# Patient Record
Sex: Female | Born: 2000 | Race: Black or African American | Hispanic: No | Marital: Single
Health system: Midwestern US, Academic
[De-identification: ages and names within clinical notes are randomized; demographics above are authoritative.]

---
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
## 2020-02-20 IMAGING — MR MRI ANKLE LT WO CONTRAST
6 of 7 series · 38 of 40 positions shown · non-contrast
Comparison: None.

INDICATION: Left ankle pain. Evaluate for peroneal strain.
TECHNIQUE: Multiplanar, multiecho imaging of the left ankle was performed without contrast, including T1-weighted and fluid sensitive sequences without intravenous contrast.

[Series 3: t2_sag_fs · sagittal · left · 3.0mm · 0.47mm/px · 5 of 22 slices shown]
[im 1/22]
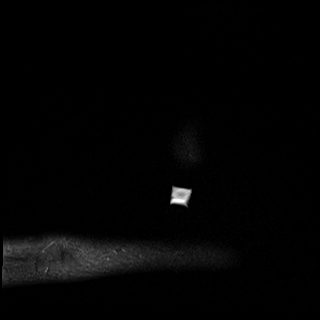
[im 6/22]
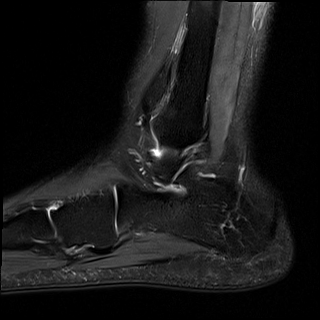
[im 11/22]
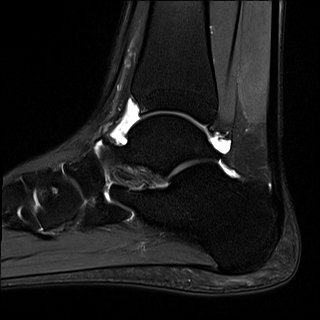
[im 16/22]
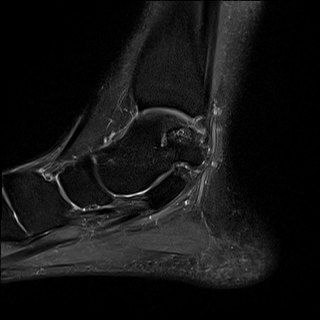
[im 22/22]
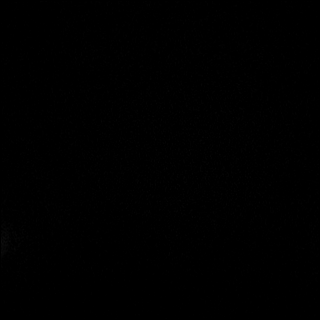

[Series 4: t1_sag · sagittal · left · 3.0mm · 0.39mm/px · 5 of 22 slices shown]
[im 1/22]
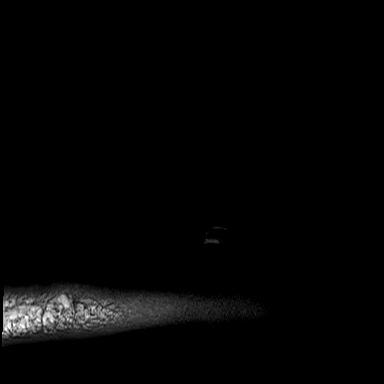
[im 6/22]
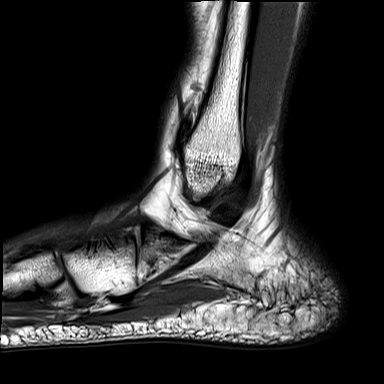
[im 11/22]
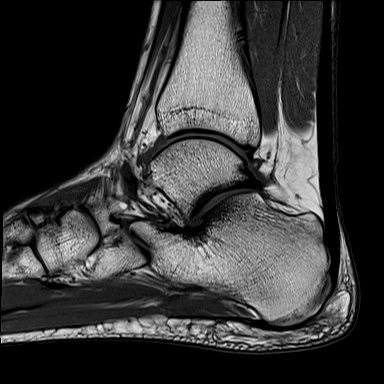
[im 16/22]
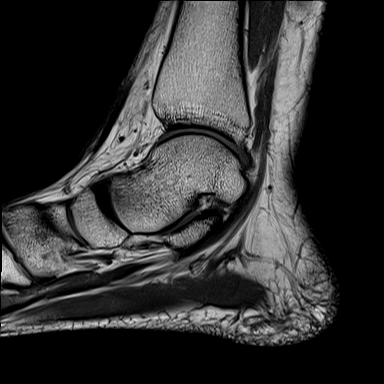
[im 22/22]
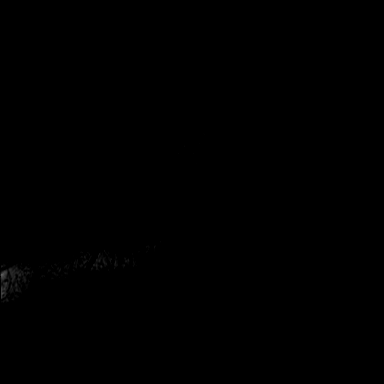

[Series 5: t1_axial · axial · left · 3.0mm · 0.39mm/px · z∈[-32,+76]mm · 6 of 28 slices shown]
[im 1/28]
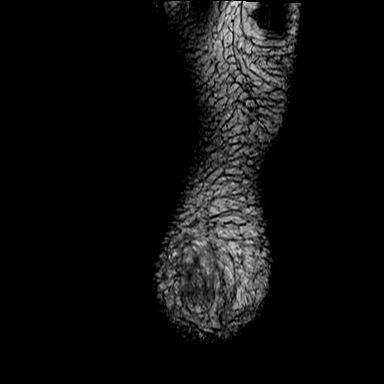
[im 6/28]
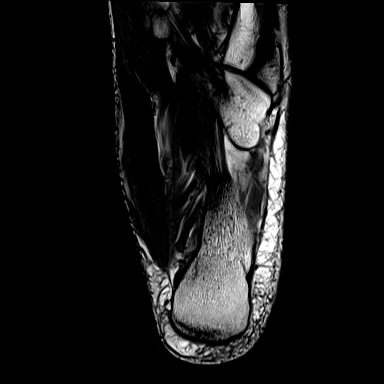
[im 11/28]
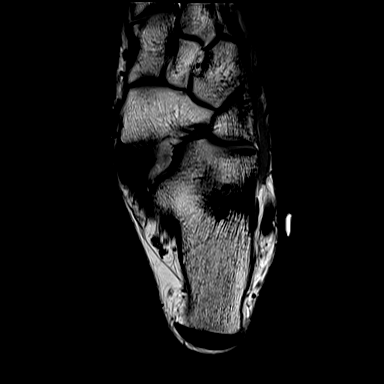
[im 17/28]
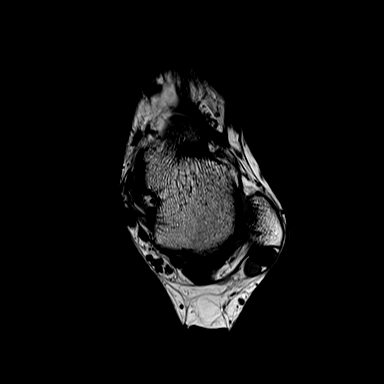
[im 22/28]
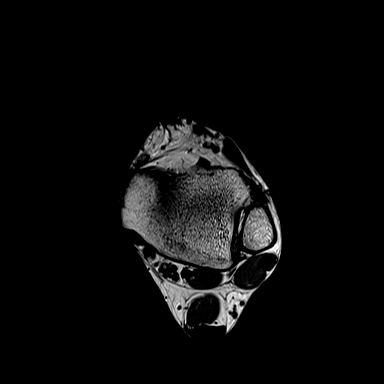
[im 28/28]
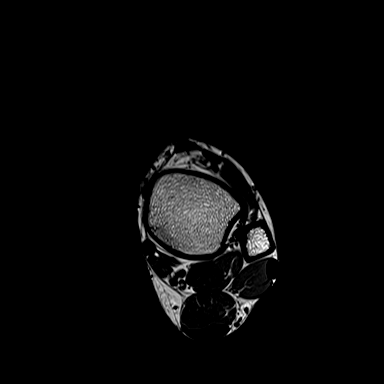

[Series 7: t2_cor_fs · coronal · left · 3.5mm · 0.47mm/px · 8 of 34 slices shown]
[im 1/34]
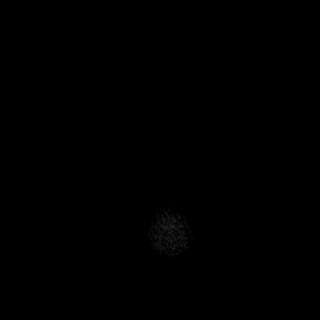
[im 5/34]
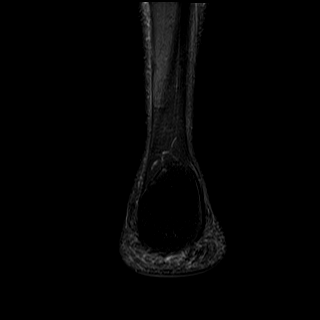
[im 10/34]
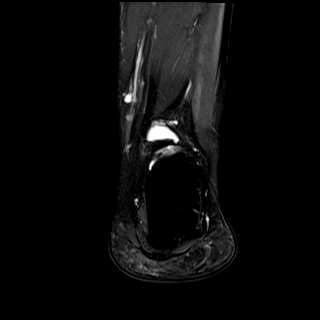
[im 15/34]
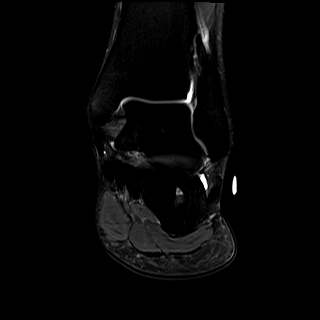
[im 19/34]
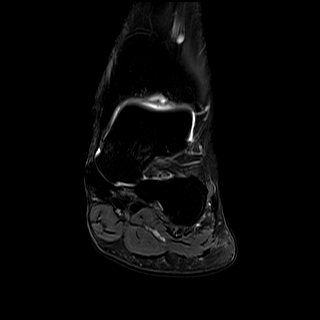
[im 24/34]
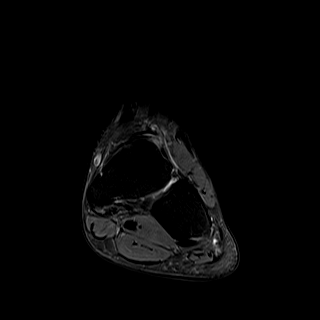
[im 29/34]
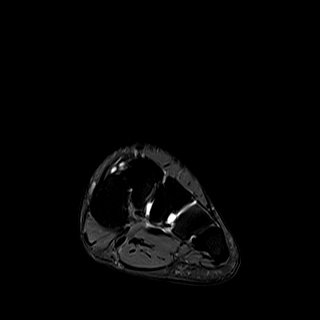
[im 34/34]
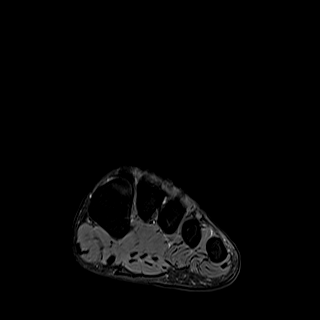

[Series 11: t2_axial_fs · axial · left · 3.0mm · 0.47mm/px · z∈[-32,+76]mm · 6 of 28 slices shown]
[im 1/28]
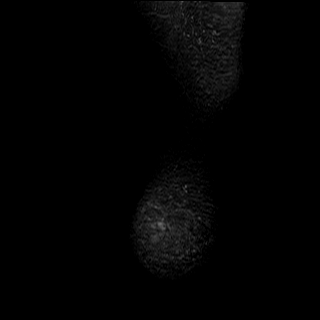
[im 6/28]
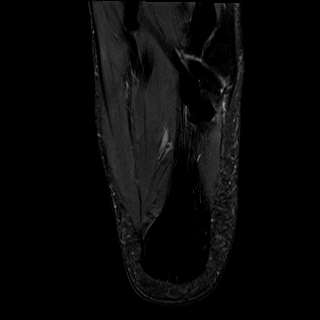
[im 11/28]
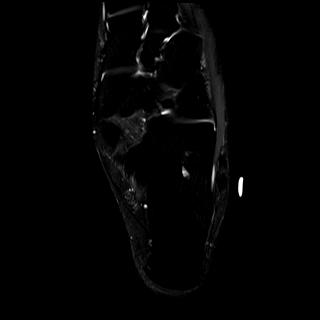
[im 17/28]
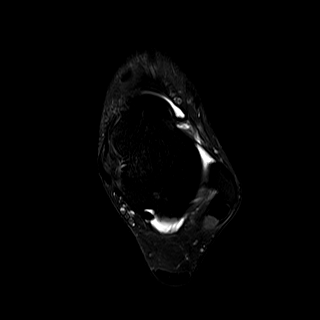
[im 22/28]
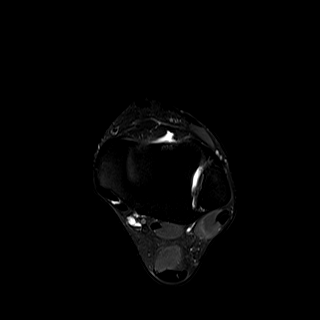
[im 28/28]
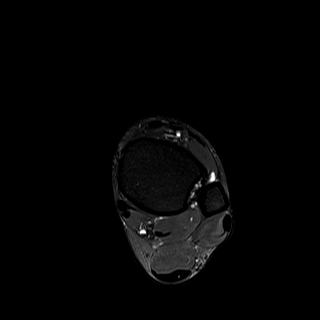

[Series 100: pd_axial_fs_obli · axial · left · 3.0mm · 0.47mm/px · z∈[+18,+83]mm · 8 of 34 slices shown]
[im 1/34]
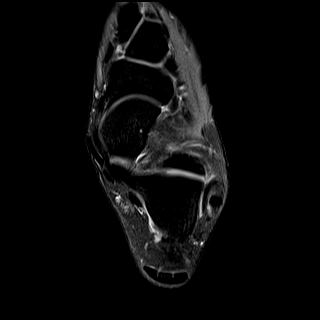
[im 5/34]
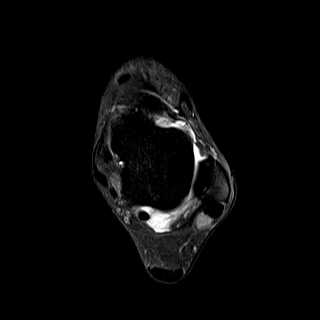
[im 10/34]
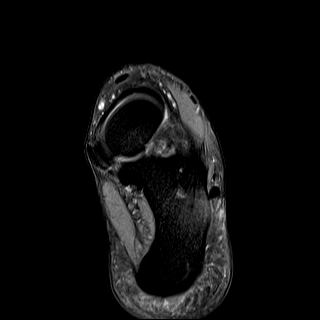
[im 15/34]
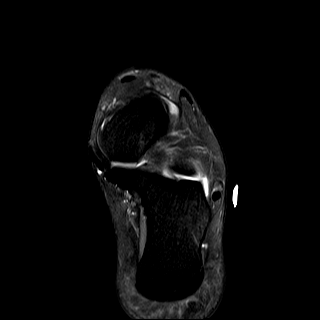
[im 19/34]
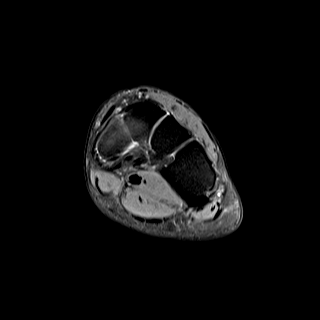
[im 24/34]
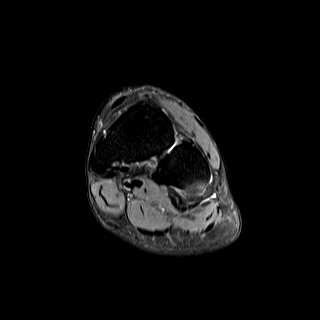
[im 29/34]
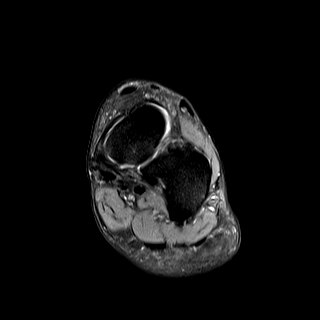
[im 34/34]
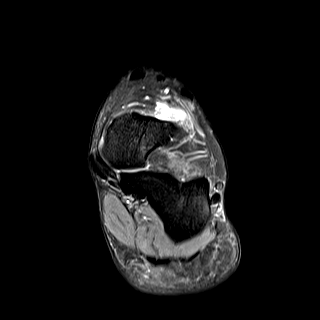

[38 of 40 positions shown; findings below may reference images not displayed]

FINDINGS: LIGAMENTS:  

Anterior and posterior tibiofibular ligaments are intact.

Anterior and posterior talofibular ligaments, and the calcaneofibular ligament, are intact.

Deltoid and spring ligaments are intact.

TENDONS:

Achilles: Intact.

Peroneus longus and brevis: Mild focal tendinosis of the peroneus brevis, in the inframalleolar region. Peroneus longus tendon is intact. No tear.

Posterior tibialis, flexor digitorum longus, and flexor hallucis longus: Intact.

Anterior extensor tendons: Intact.

OTHER:

Sinus tarsi: Normal.

Tarsal tunnel: Normal.

Plantar fascia: Normal.

Marrow and cartilage: No acute fracture. No bone contusion. No erosion. No tarsal coalition. No osteochondral erosion of the talar dome.

Small tibiotalar joint effusion. Question of small low-grade partial-thickness chondral defect at the anterior aspect of the tibial plafond (series 3, image 11).

Edema noted within the partially imaged distal peroneus longus/brevis muscles, at the level of the distal fibular metaphysis, likely overuse/delayed onset muscle soreness.
IMPRESSION: 1. Mild focal tendinosis of the peroneus brevis, in the inframalleolar region. No tenosynovitis or tear of the lateral flexor tendons.

2. Small tibiotalar joint effusion.

3. Edema within the partially visualized distal peroneus longus/brevis muscles, at the level of the distal fibular metaphysis, likely overuse/delayed onset muscle soreness.

## 2020-05-29 IMAGING — MR MRI FOREFOOT RT WO CONTRAST
6 series · 34 of 40 positions shown · non-contrast
Comparison: None available.

INDICATION: Right foot pain
TECHNIQUE: Multiplanar, multisequence imaging of the right forefoot was performed without contrast.

[Series 3: t1_sag · sagittal · right · 3.0mm · 0.37mm/px · 7 of 32 slices shown]
[im 1/32]
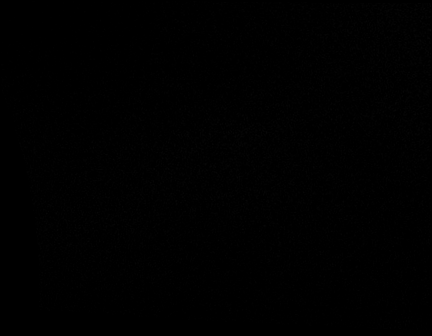
[im 6/32]
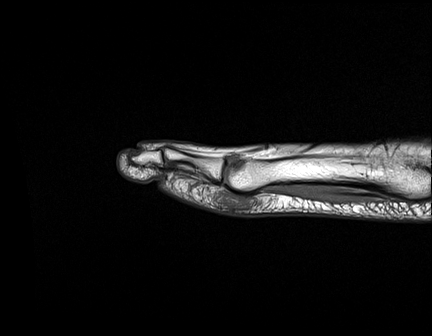
[im 11/32]
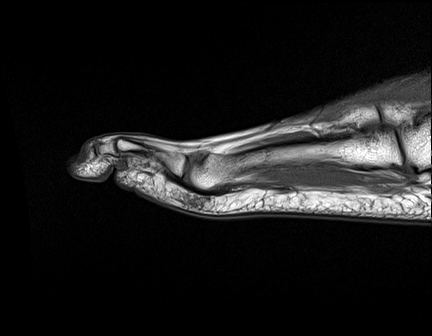
[im 16/32]
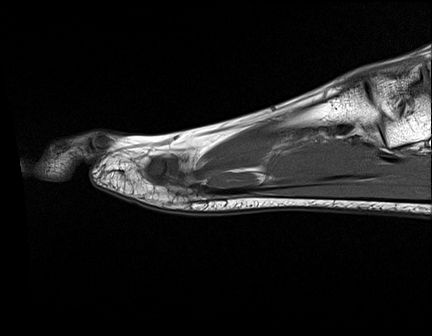
[im 21/32]
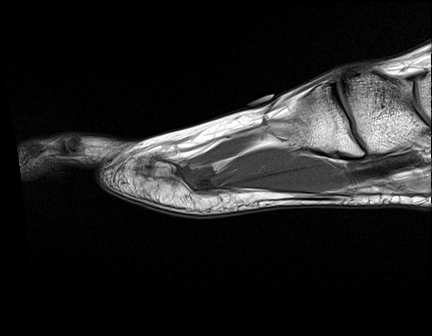
[im 26/32]
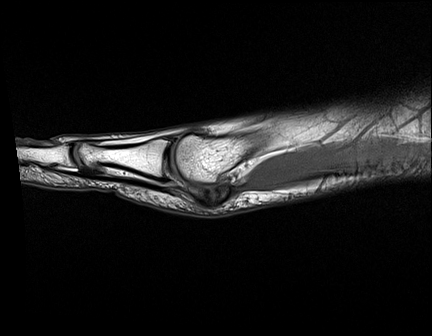
[im 32/32]
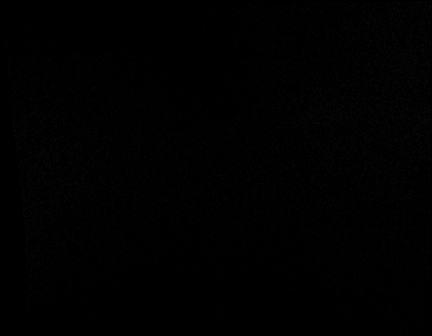

[Series 4: t2_sag_fs · sagittal · right · 3.0mm · 0.50mm/px · 7 of 32 slices shown]
[im 1/32]
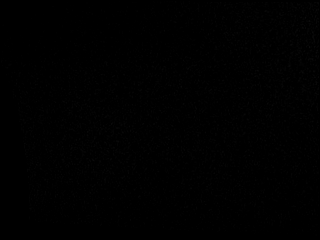
[im 6/32]
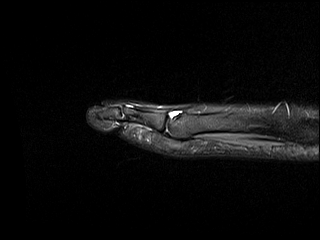
[im 11/32]
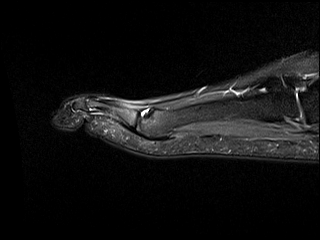
[im 16/32]
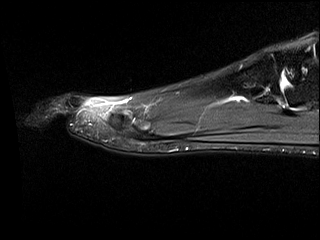
[im 21/32]
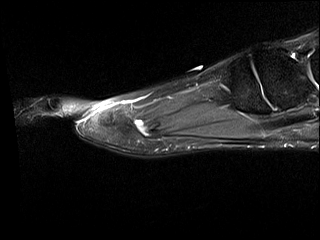
[im 26/32]
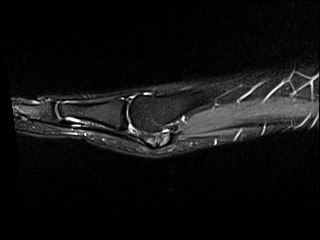
[im 32/32]
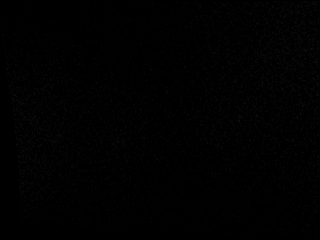

[Series 5: t1_axial · axial · right · 3.0mm · 0.36mm/px · z∈[-112,-52]mm · 4 of 20 slices shown]
[im 1/20]
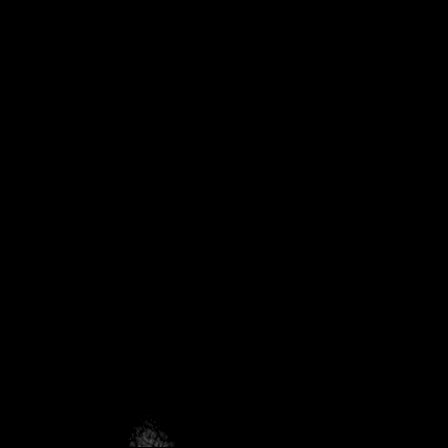
[im 7/20]
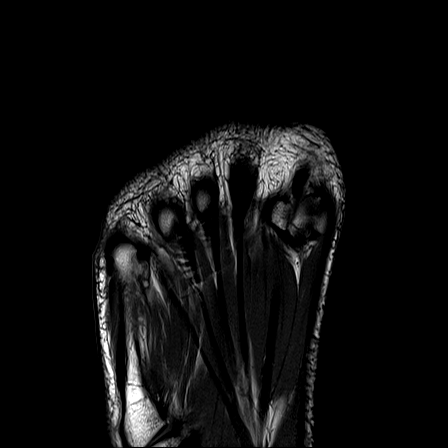
[im 13/20]
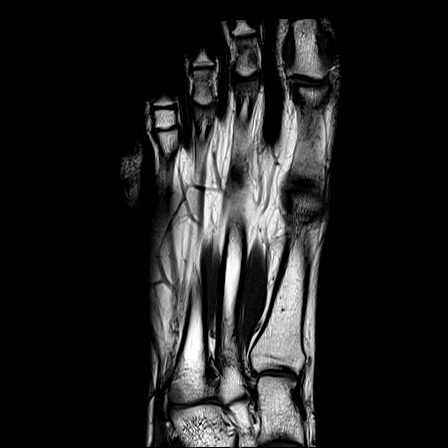
[im 20/20]
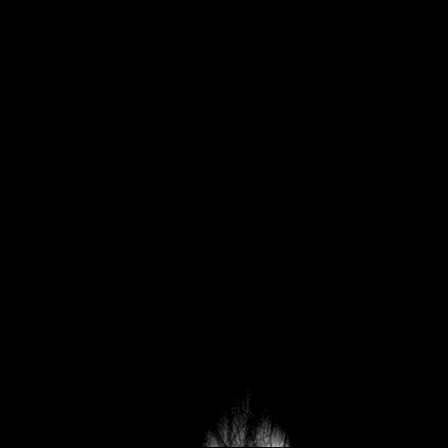

[Series 6: t2_axial_fs · axial · right · 3.0mm · 0.26mm/px · z∈[-112,-52]mm · 4 of 20 slices shown]
[im 1/20]
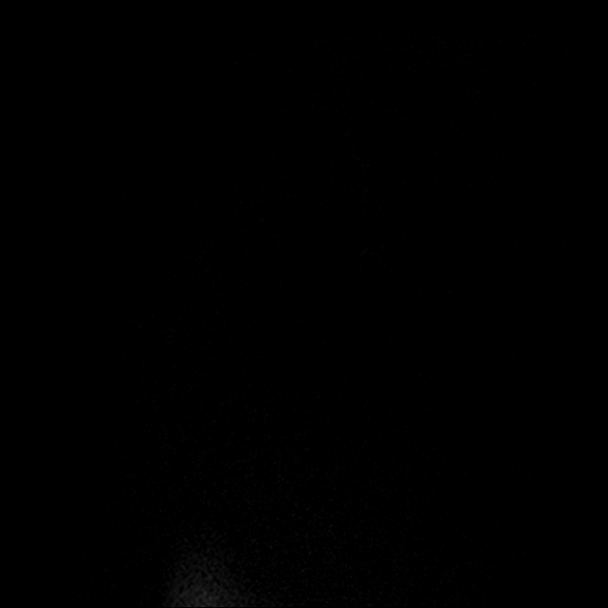
[im 7/20]
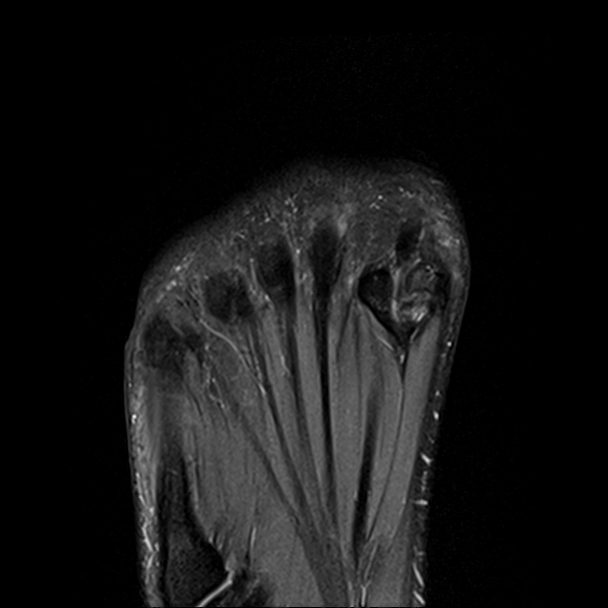
[im 13/20]
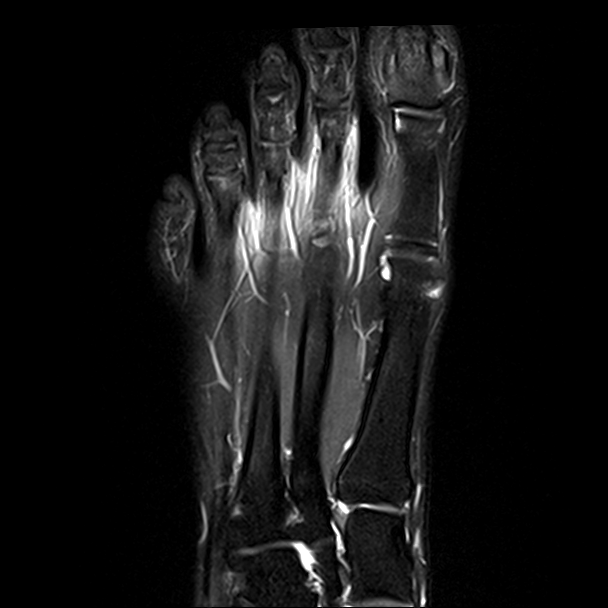
[im 20/20]
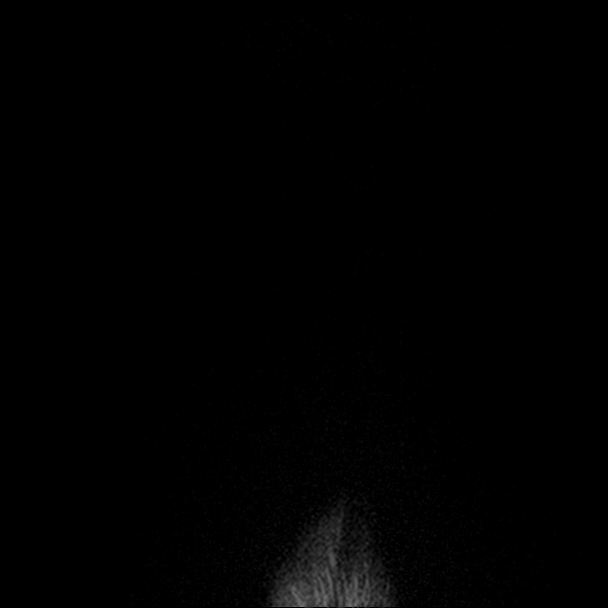

[Series 7: t1_cor · coronal · right · 3.0mm · 0.36mm/px · 8 of 40 slices shown]
[im 1/40]
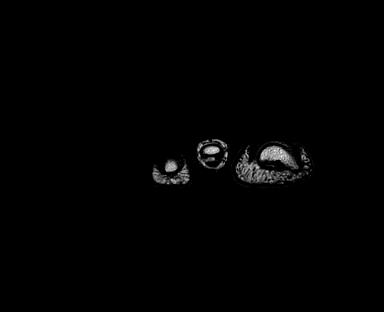
[im 5/40]
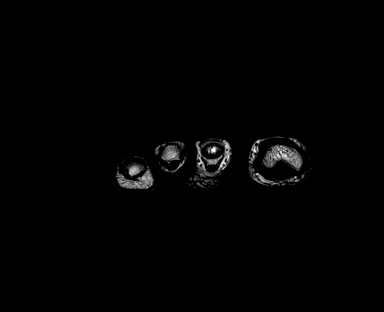
[im 10/40]
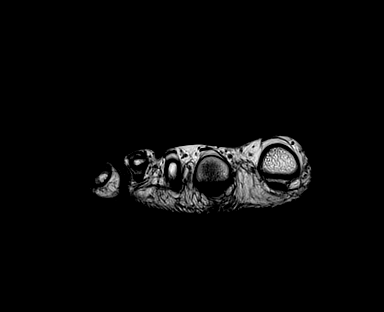
[im 15/40]
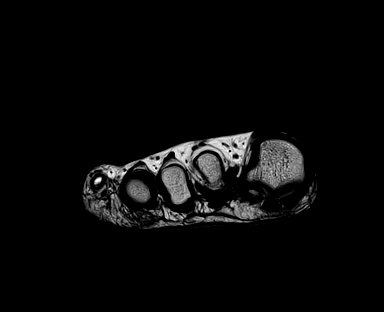
[im 25/40]
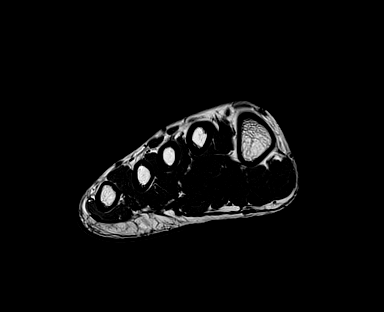
[im 30/40]
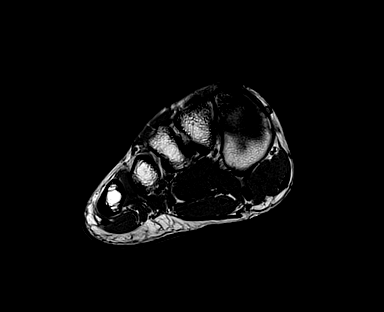
[im 35/40]
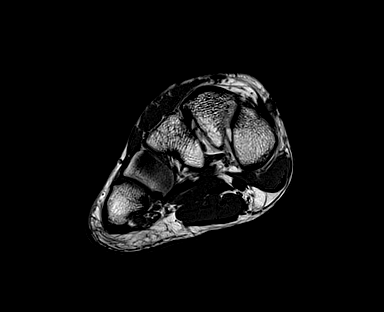
[im 40/40]
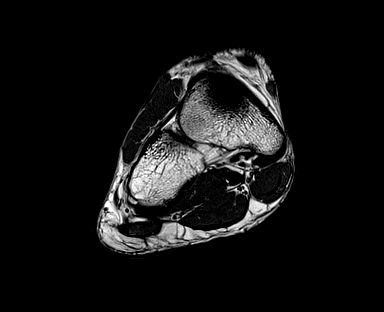

[Series 8: ir_cor · coronal · right · 3.0mm · 0.38mm/px · 4 of 40 slices shown]
[im 1/40]
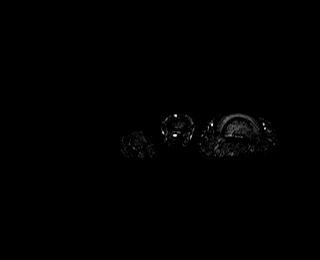
[im 5/40]
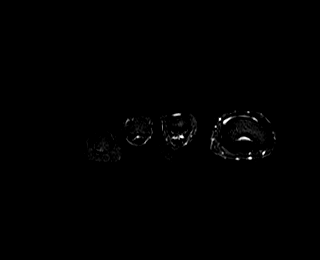
[im 10/40]
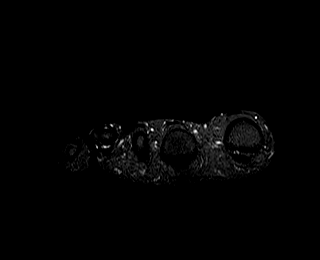
[im 15/40]
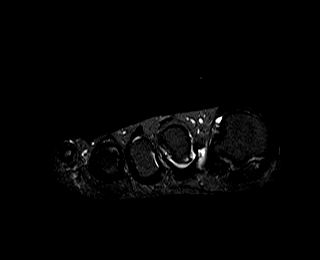

[34 of 40 positions shown; findings below may reference images not displayed]

FINDINGS: Osseous: Transverse cleft through the middle third of the tibial hallux sesamoid with moderate associated bone marrow edema with mild corresponding T1 signal bone density. No additional areas of bone marrow edema. The joint spaces are maintained. No marginal erosions are identified.

Plantar plate: Intact.

Lisfranc ligament: Intact.

Musculotendinous: The visualized flexor and extensor tendons are intact.

Other: No soft tissue mass. No significant intermetatarsal bursal fluid. No perineural fibrosis.
IMPRESSION: Transverse cleft through the middle third of the tibial hallux sesamoid, with moderate associated bone marrow edema. These findings are favored for sesamoiditis of a bipartite tibial hallux sesamoid bone. A subacute fracture is a less likely differential consideration.

## 2020-07-08 ENCOUNTER — Ambulatory Visit: Admit: 2020-07-08 | Discharge: 2020-07-08 | Payer: BC Managed Care – PPO

## 2020-07-08 ENCOUNTER — Encounter: Admit: 2020-07-08 | Discharge: 2020-07-08 | Payer: BC Managed Care – PPO

## 2020-07-08 ENCOUNTER — Encounter: Admit: 2020-07-08 | Discharge: 2020-07-08 | Payer: Private Health Insurance - Indemnity

## 2020-07-23 ENCOUNTER — Encounter: Admit: 2020-07-23 | Discharge: 2020-07-23 | Payer: BC Managed Care – PPO

## 2021-06-11 ENCOUNTER — Ambulatory Visit: Admit: 2021-06-12 | Discharge: 2021-06-11 | Payer: BC Managed Care – PPO

## 2021-06-11 ENCOUNTER — Encounter: Admit: 2021-06-11 | Discharge: 2021-06-11 | Payer: BC Managed Care – PPO

## 2021-06-11 DIAGNOSIS — Z4802 Encounter for removal of sutures: Secondary | ICD-10-CM

## 2021-06-12 NOTE — Progress Notes
Pt is here for suture removal.

## 2021-08-28 IMAGING — MR MRI FOREFOOT RT WO CONTRAST
6 series · 40 of 40 positions shown · non-contrast
Comparison: 05/29/2020

HISTORY: Right foot pain. Subsequent encounter for right foot fracture with routine healing.
TECHNIQUE: Multisequence multiplanar MR images were obtained of the right forefoot, without intravenous contrast.

[Series 3: t1_cor · coronal · right · 3.5mm · 0.36mm/px · 8 of 40 slices shown]
[im 1/40]
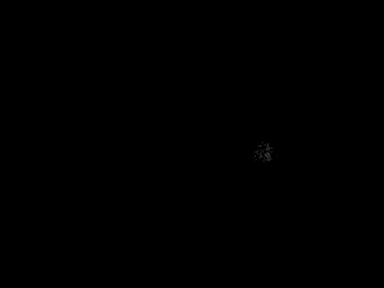
[im 6/40]
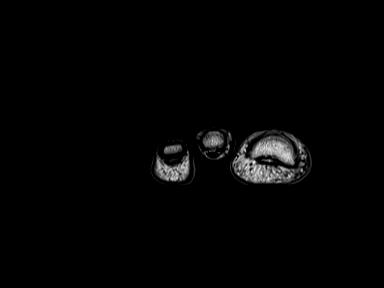
[im 12/40]
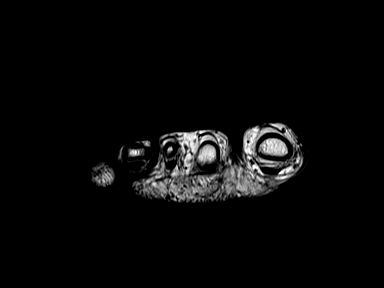
[im 17/40]
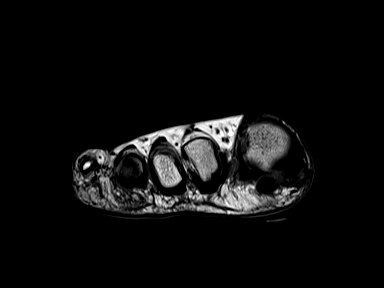
[im 23/40]
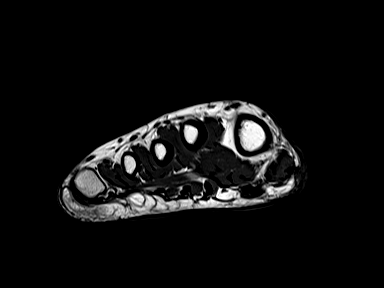
[im 28/40]
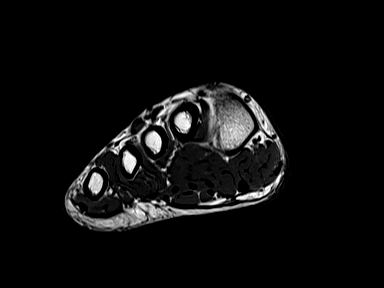
[im 34/40]
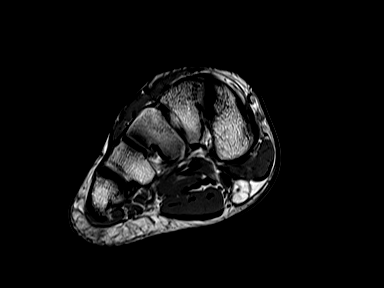
[im 40/40]
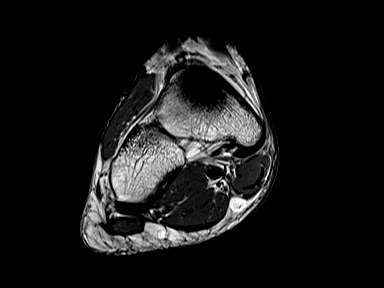

[Series 4: stir_cor · coronal · right · 3.5mm · 0.44mm/px · 8 of 40 slices shown]
[im 1/40]
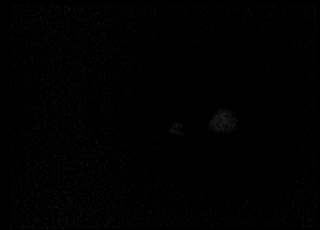
[im 6/40]
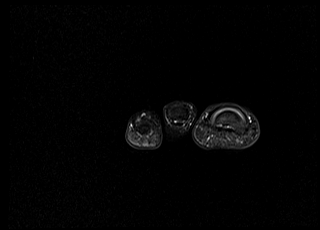
[im 12/40]
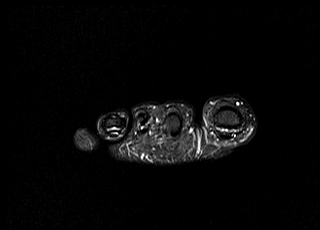
[im 17/40]
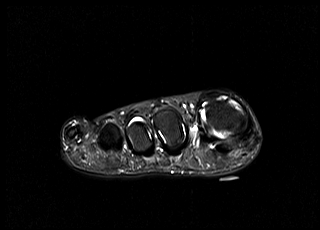
[im 23/40]
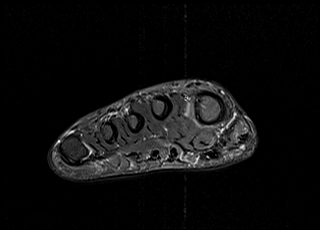
[im 28/40]
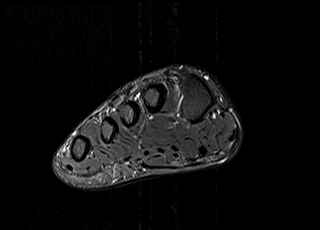
[im 34/40]
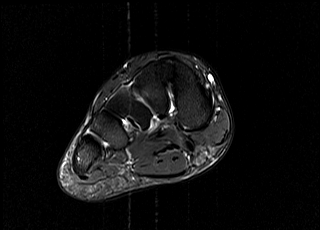
[im 40/40]
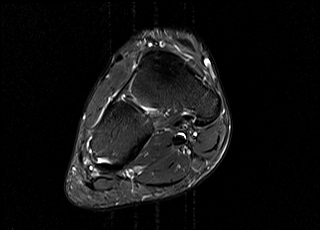

[Series 5: t1_sag · sagittal · right · 3.0mm · 0.41mm/px · 7 of 30 slices shown]
[im 1/30]
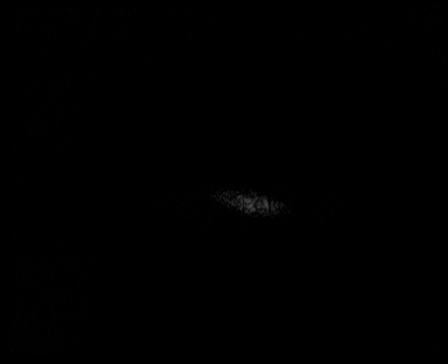
[im 5/30]
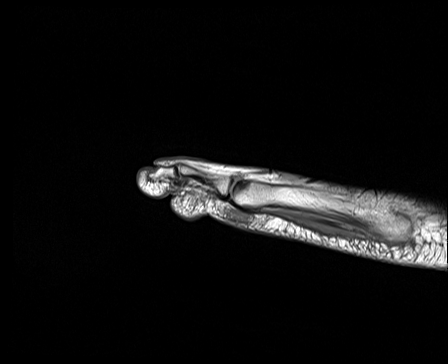
[im 10/30]
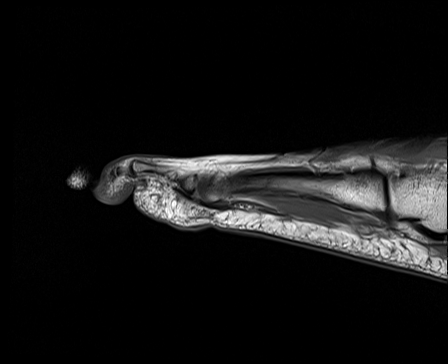
[im 15/30]
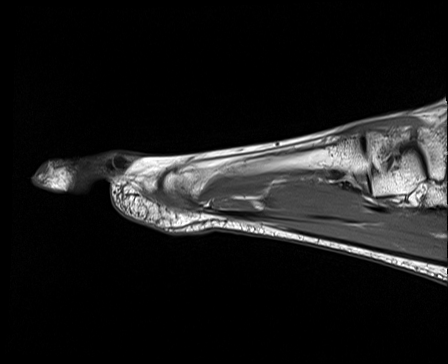
[im 20/30]
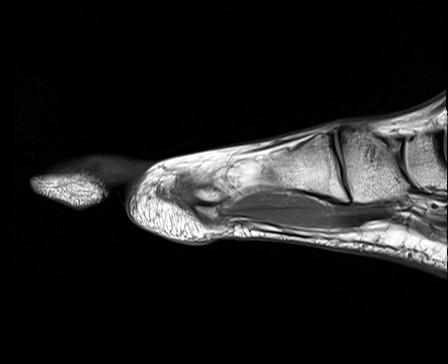
[im 25/30]
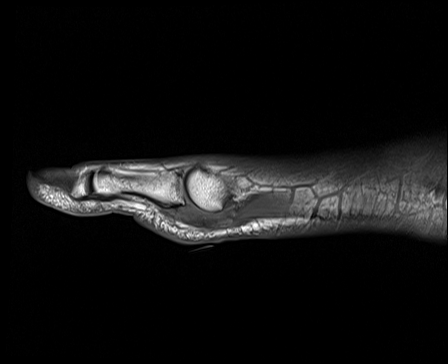
[im 30/30]
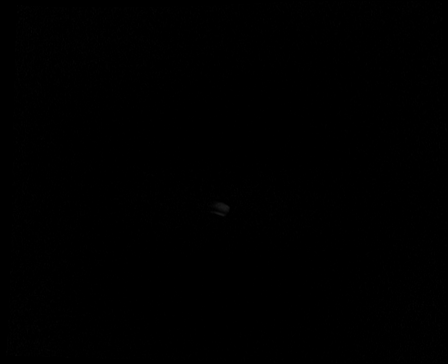

[Series 6: t2_sag_fs · sagittal · right · 3.0mm · 0.58mm/px · 7 of 30 slices shown]
[im 1/30]
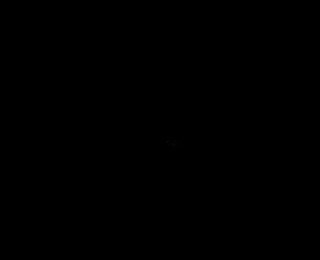
[im 5/30]
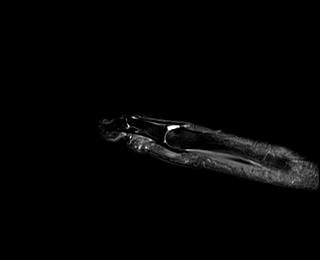
[im 10/30]
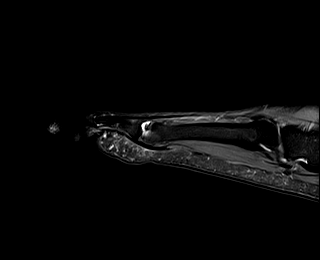
[im 15/30]
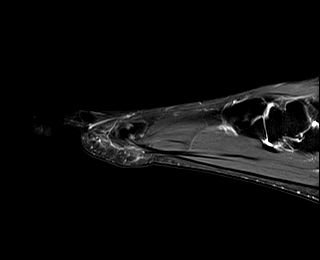
[im 20/30]
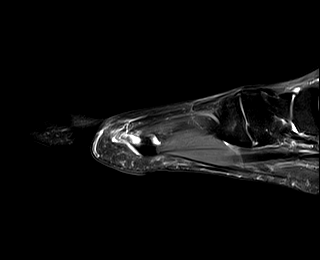
[im 25/30]
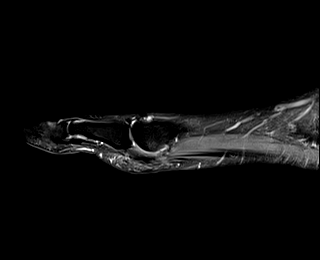
[im 30/30]
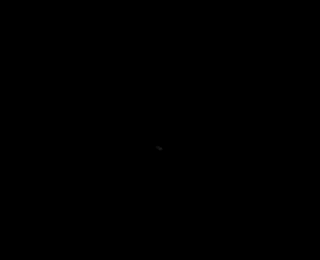

[Series 7: t1_axial · axial · right · 3.0mm · 0.48mm/px · z∈[-78,-10]mm · 5 of 22 slices shown]
[im 1/22]
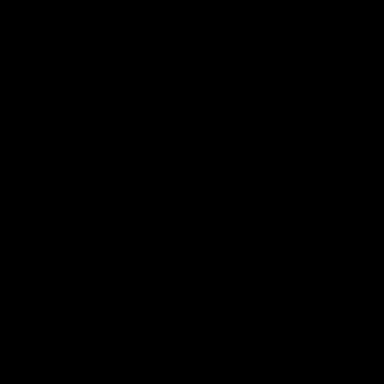
[im 6/22]
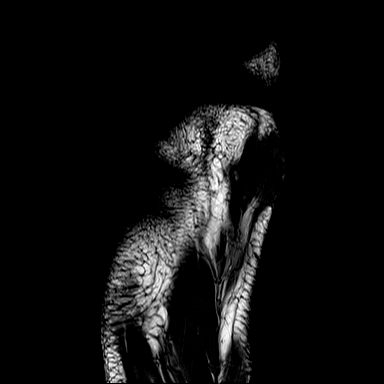
[im 11/22]
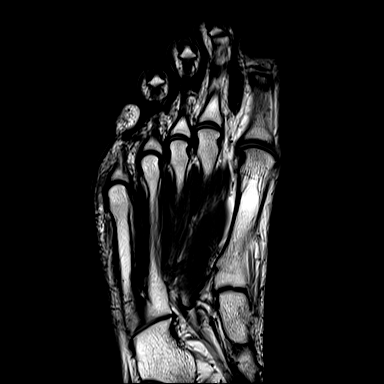
[im 16/22]
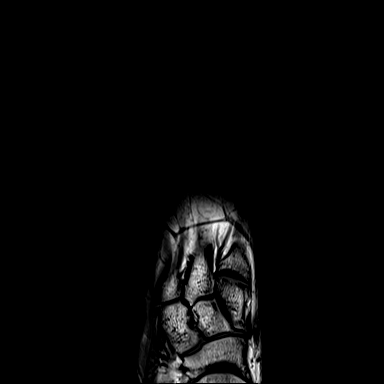
[im 22/22]
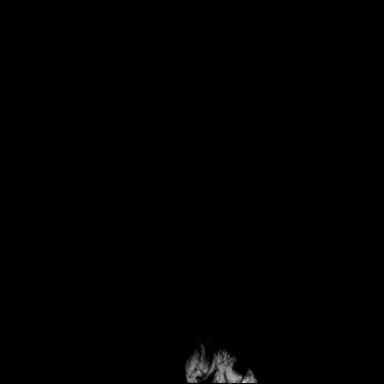

[Series 8: t2_axial_fs · axial · right · 3.0mm · 0.58mm/px · z∈[-78,-10]mm · 5 of 22 slices shown]
[im 1/22]
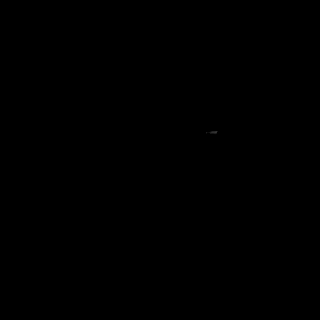
[im 6/22]
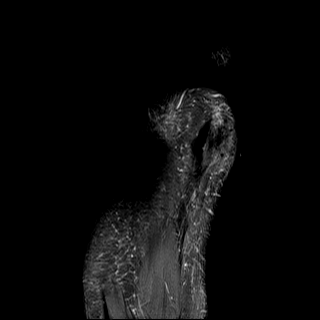
[im 11/22]
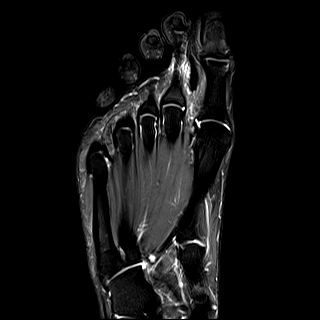
[im 16/22]
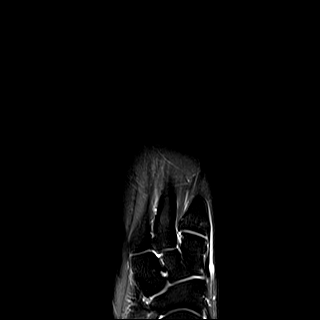
[im 22/22]
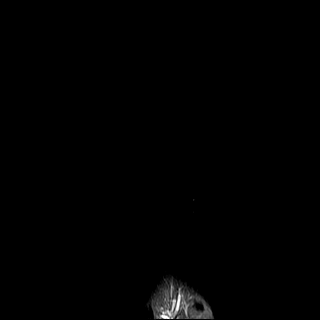

[40 of 40 positions shown; findings below may reference images not displayed]

FINDINGS: Interval resection of the tibial hallux sesamoid. No postop fluid collections. No significant amount of edema.

Fibular hallux sesamoid unremarkable.

Moderate tendinosis involving the flexor hallucis longus tendon at the level of the hallux sesamoids.

There are no MRI findings to suggest neuroma. Mild intermetatarsal head bursitis first interspace.

The plantar plates are unremarkable.  The visualized portions of the remaining flexor and extensor tendons appear unremarkable.

Joint spaces appear adequately maintained. No effusions. No visible erosions. Collateral ligaments intact.

Lisfranc ligament clearly intact.

Osseous structures demonstrate no fractures or destructive lesions.

Visualized intrinsic foot musculature unremarkable.
IMPRESSION: 1.
Interval resection of the tibial hallux sesamoid.

2.
Moderate tendinosis involving the flexor hallucis longus tendon at the level of the hallux sesamoids.

3.
Mild intermetatarsal head bursitis first interspace.

## 2021-09-14 IMAGING — RF ARTHROCENTESIS INTERMED JOINT
2 series · 2 of 2 positions shown · non-contrast
Comparison: Right foot MRI 08/28/2021.

PROCEDURE:  Fluoroscopic-guided right fibular hallux sesamoid-metatarsal  joint injection.

CLINICAL HISTORY AND INDICATION/ PRE-PROCEDURE DIAGNOSIS: Right foot pain.

[Series 1: cp_standard · 0.17mm/px · 1 of 1 slices shown (1 of 2)]
[im 1/1]
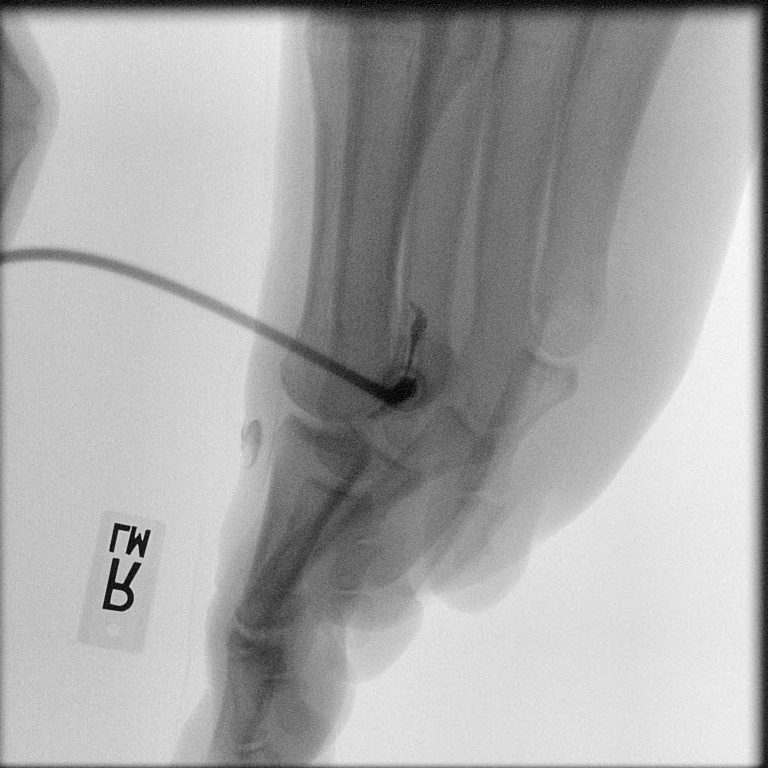

[Series 2: cp_standard · 0.17mm/px · 1 of 1 slices shown (2 of 2)]
[im 1/1]
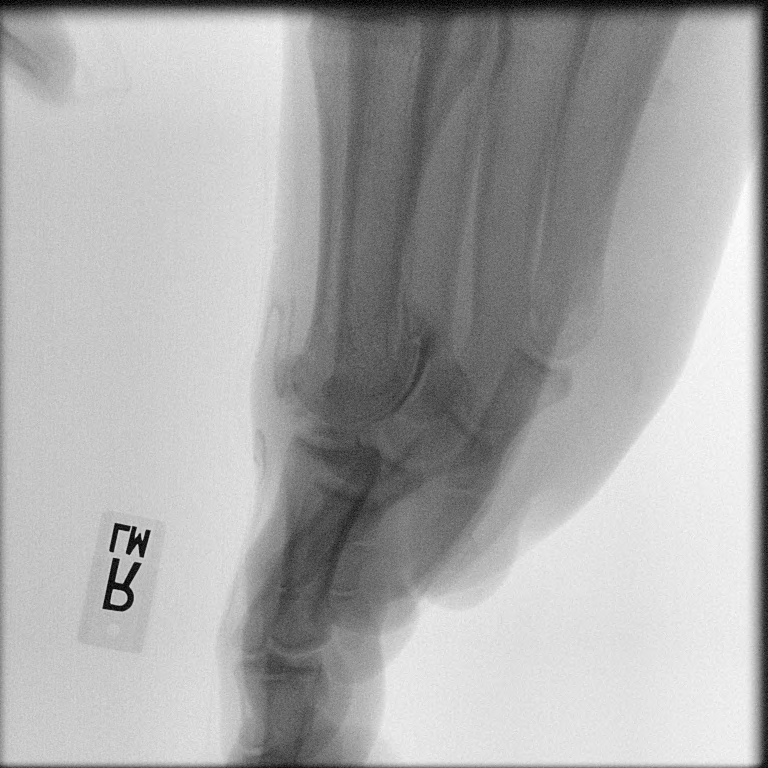

[2 of 2 positions shown; findings below may reference images not displayed]

CONSENT: The risks, benefits and alternatives of image-guided joint injection were discussed in detail, including risks of bleeding, infection, pain and allergic reaction.  All questions answered and verbal and written informed consent was obtained.

DESCRIPTION OF PROCEDURE:  The patient was placed supine on the fluoroscopic table.  A timeout procedure was performed prior to lidocaine administration. The skin over the medial right forefoot  was prepped and draped in standard sterile fashion. Local anesthetic was provided in the skin and subcutaneous tissues with 1-2 mL of 1% lidocaine. 

Using intermittent fluoroscopic guidance, a 22 gauge needle was placed into the right fibular hallux sesamoid-metatarsal  joint. A small amount of 1% lidocaine injected into the joint flowed freely into the joint.  Placement was confirmed with injection of approximately 2 mL of Omnipaque 240 iodinated contrast

A steroid/anesthetic mixture was then injected into the right fibular hallux sesamoid-metatarsal joint. A mixture of 2 mL Kenalog and 2 mL ropivacaine was injected.  

 The needle was removed and hemostasis was achieved. No immediate complications.

Estimated blood loss: Minimal.

RADIATION DOSE:

Fluoroscopic images: 2

Fluoroscopy Time:

Dose area product: 71.98  microGy*m2

Reference air kerma: 5.40  mGy

IMPRESSION/ POST-PROCEDURE DIAGNOSIS: 

Successful fluoroscopically guided right fibular hallux sesamoid-metatarsal  joint injection of anesthetic and steroid.

Patient's reported pain level prior to procedure was  [DATE].  

Patient's reported pain level after procedure was 0/10.

## 2022-01-18 IMAGING — RF ARTHROCENTESIS INTERMED JOINT
2 series · 2 of 2 positions shown · non-contrast
Comparison: None..

HISTORY: 21-year-old female. Right foot pain.

[Series 3: cp_bariatric test · 0.18mm/px · 1 of 1 slices shown (1 of 2)]
[im 1/1]
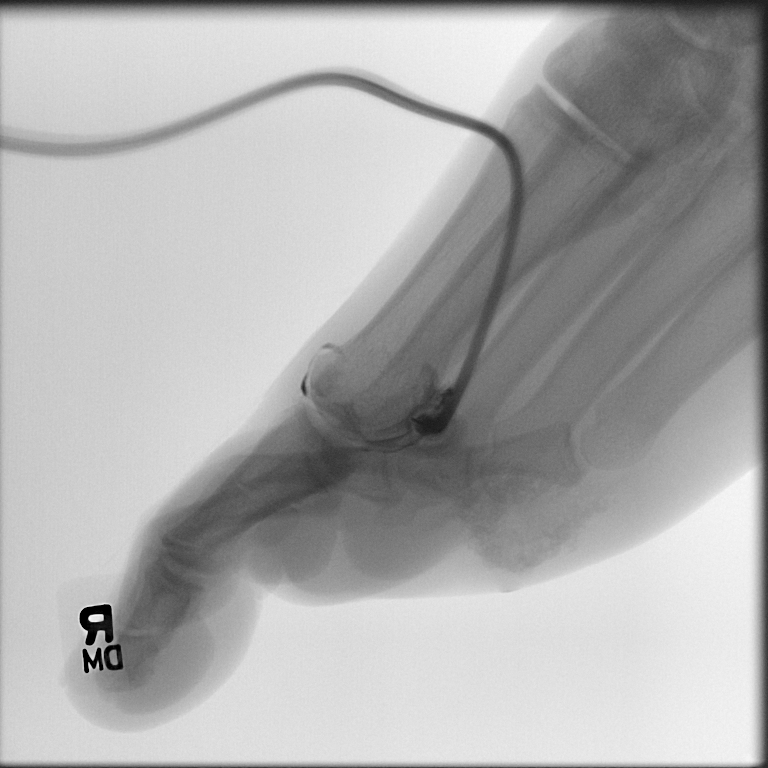

[Series 4: cp_bariatric test · 0.18mm/px · 1 of 1 slices shown (2 of 2)]
[im 1/1]
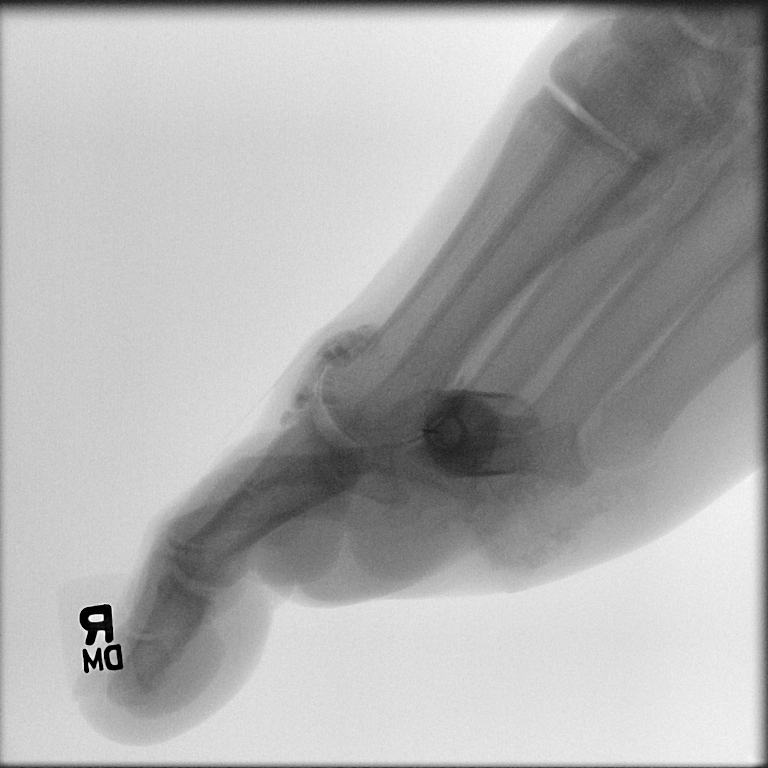

[2 of 2 positions shown; findings below may reference images not displayed]

PROCEDURE:  Fluoroscopic-guided right fibular/tibial hallux sesamoid-metatarsal/first metatarsophalangeal joint injection

CONSENT:

The risks, benefits and alternatives of image-guided joint injection were discussed in detail, including risks of bleeding, infection, pain and allergic reaction.  All questions answered and verbal and written informed consent was obtained.
PROCEDURE:  

A time-out was performed and documented prior to the start of the procedure. 

The patient was placed right-side-down laterally on the fluoroscopy table. A timeout procedure was performed prior to lidocaine administration. The skin over the medial/plantar forefoot was prepped and draped in routine sterile fashion.  Local anesthetic was provided in the skin and subcutaneous tissues with 1-2 mL of 1% lidocaine. 

Using intermittent fluoroscopic guidance, a 22-gauge needle was placed into the right fibular/tibial hallux sesamoid-metatarsal/first metatarsophalangeal joint.  Placement was confirmed with injection of approximately 0.5 mL of iodinated contrast. Subsequently, approximately 2 mL of a combination of 1 mL ropivacaine, 1 mL of Kenalog were injected into the joint with intermittent fluoroscopic images demonstrating disbursement of the iodinated contrast.

COMPLICATIONS:

No evidence of bleeding or other complication identified.  The patient tolerated the procedure well.

ESTIMATED BLOOD LOSS: 

Minimal

RADIATION DOSE:

Fluoroscopic images: 2

Fluoroscopy time: 0.2 minutes

Dose area product: 2.25 microGy*m2

Reference air kerma: 0.2 mGy
IMPRESSION: Technically successful right fibular/tibial hallux sesamoid-metatarsal/first metatarsophalangeal joint injection.

Preprocedural pain level: [DATE]

Postprocedural pain level: [DATE]

## 2022-03-03 IMAGING — CR FOOT LT 3 VWS MIN
1 series · 3 of 3 positions shown · non-contrast
Comparison: None

INDICATION: Sesamoiditis
TECHNIQUE: Left foot radiographs, 3 views

[Series 1: lat · 0.17mm/px · 3 of 3 slices shown]
[im 1/3]
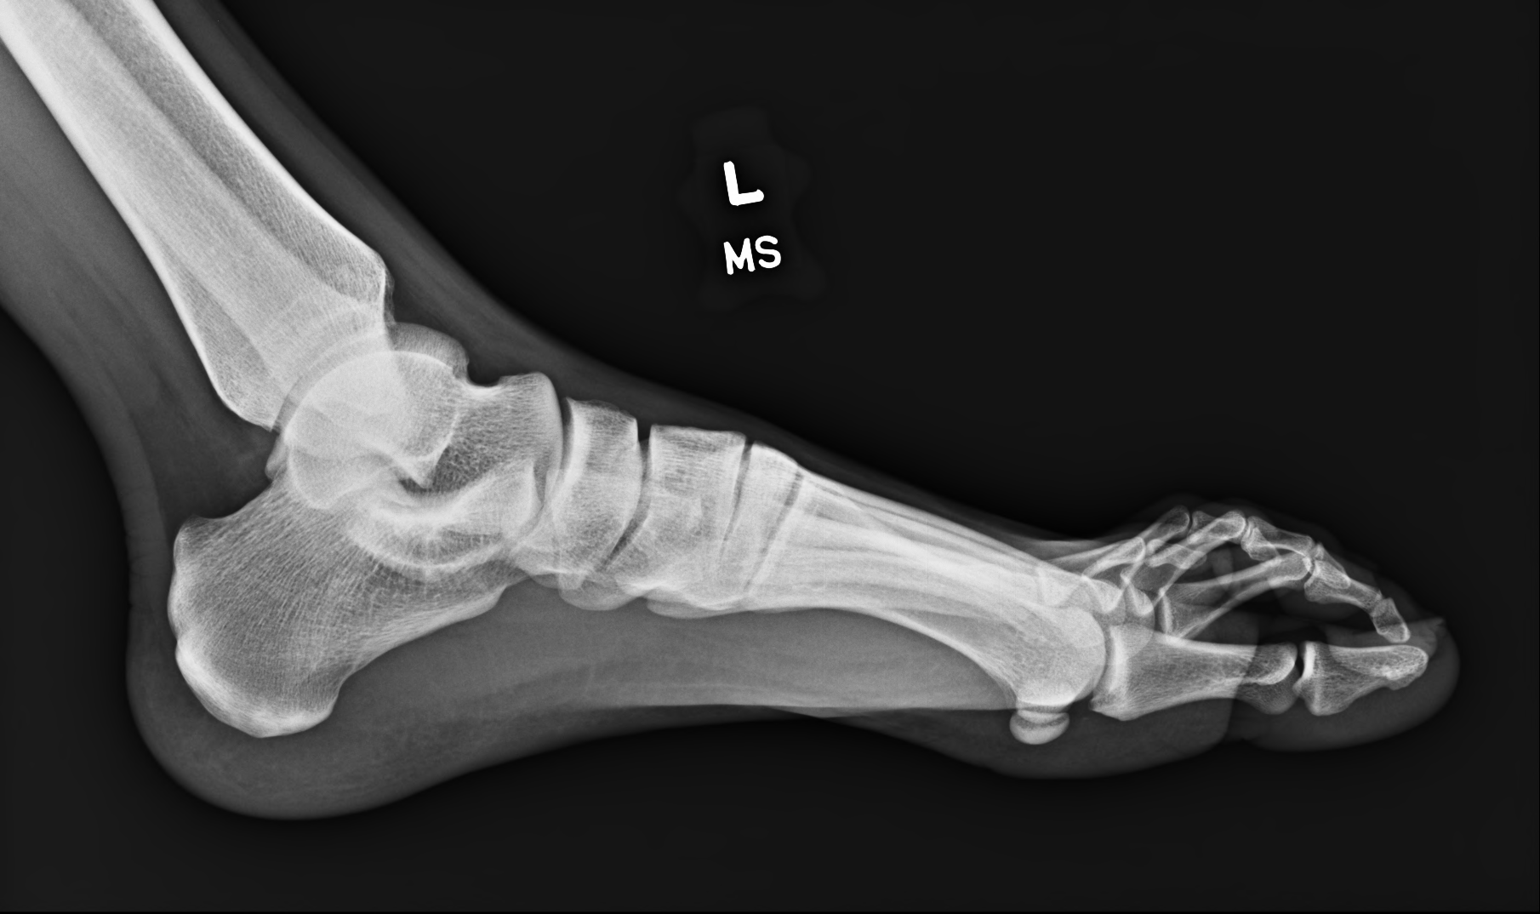
[im 2/3]
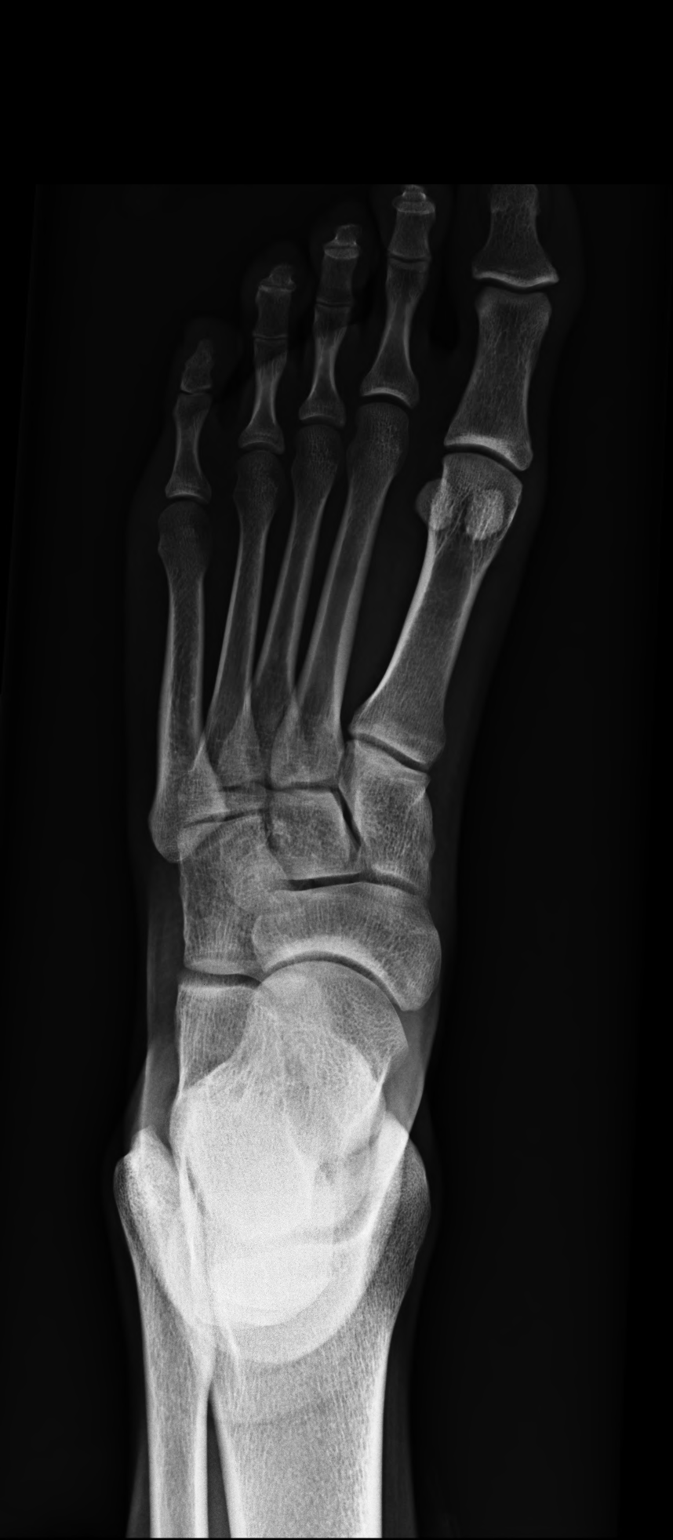
[im 3/3]
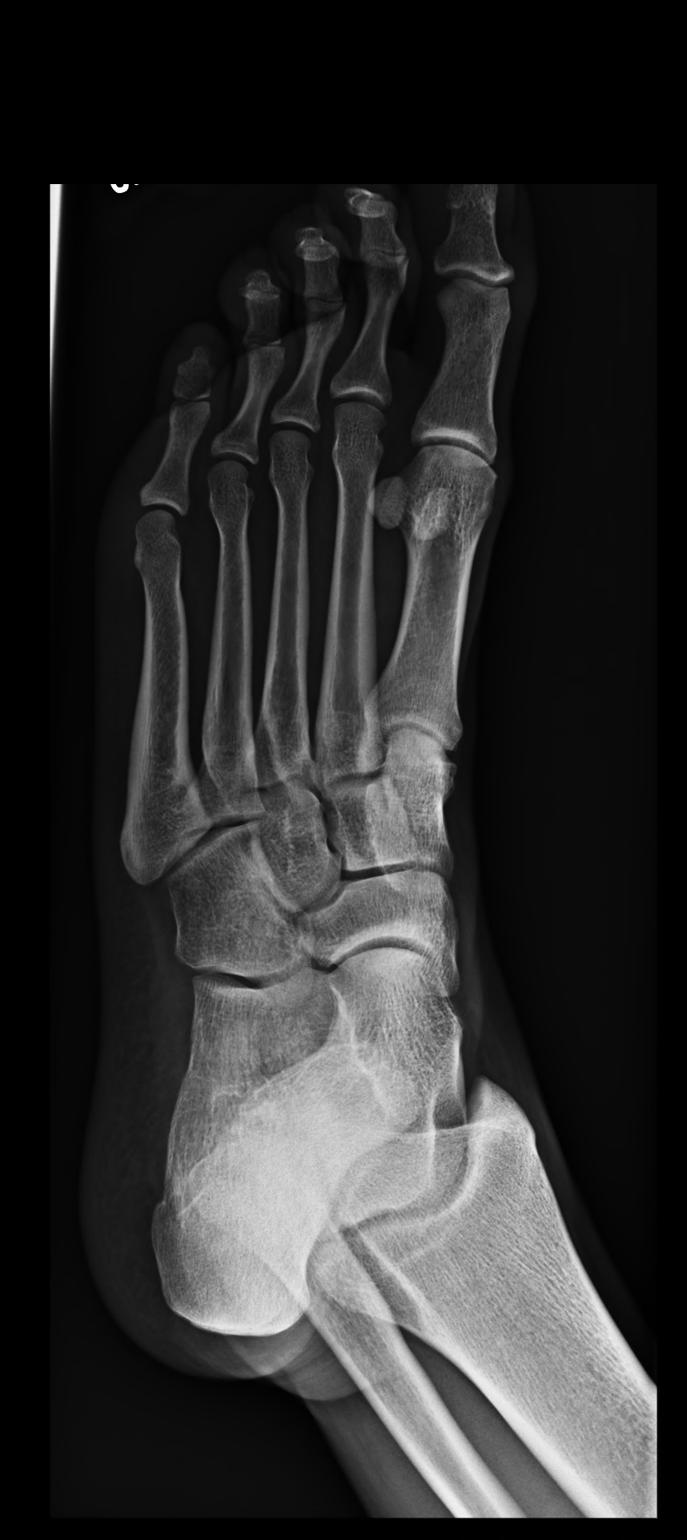

[3 of 3 positions shown; findings below may reference images not displayed]

FINDINGS: No acute fracture. No dislocation. Joint spaces are intact. No erosion.
IMPRESSION: Unremarkable left foot radiographs. Consider MRI to assess for subtle sesamoiditis.

## 2022-03-09 IMAGING — MR MRI FOREFOOT LT WO CONTRAST
6 of 7 series · 36 of 40 positions shown · non-contrast
Comparison: Left foot radiographs 03/03/2022. Left ankle MRI 02/20/2020.

INDICATION: Pain in left foot
TECHNIQUE: Multiplanar, multiecho imaging of the left forefoot was performed, including T1-weighted and fluid sensitive sequences.

[Series 3: t1_cor · coronal · left · 3.0mm · 0.36mm/px · 7 of 42 slices shown]
[im 1/42]
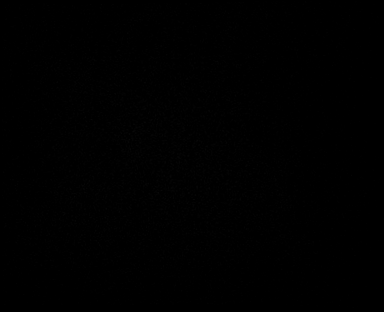
[im 7/42]
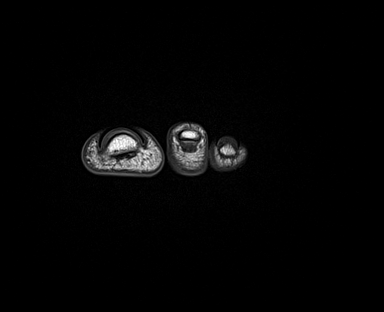
[im 14/42]
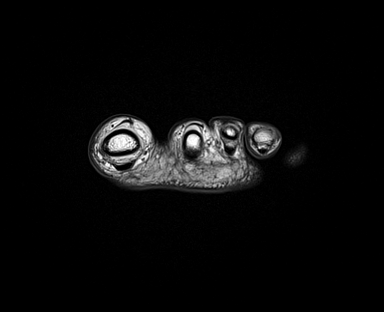
[im 21/42]
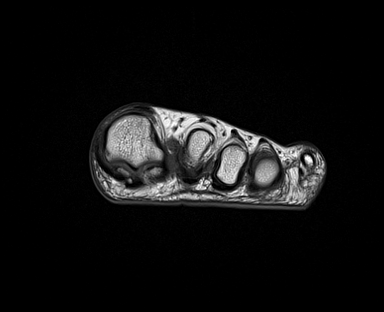
[im 28/42]
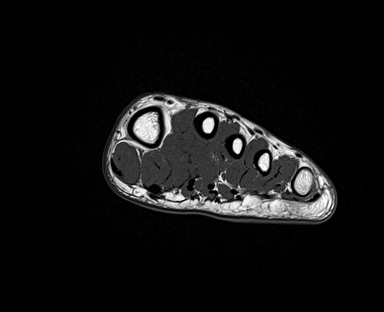
[im 35/42]
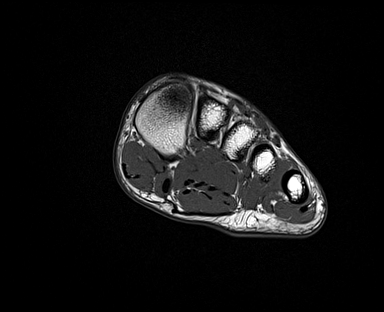
[im 42/42]
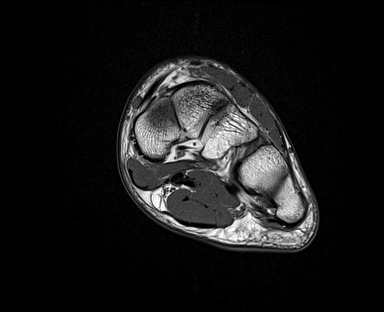

[Series 4: ir_cor · coronal · left · 3.0mm · 0.41mm/px · 7 of 42 slices shown]
[im 1/42]
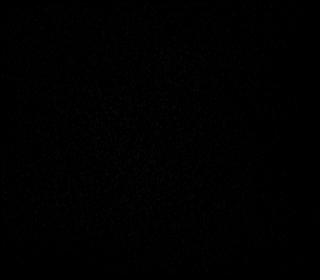
[im 7/42]
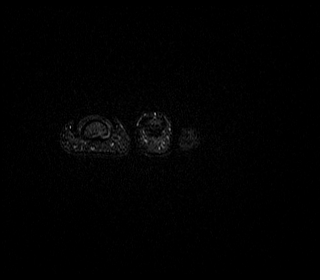
[im 14/42]
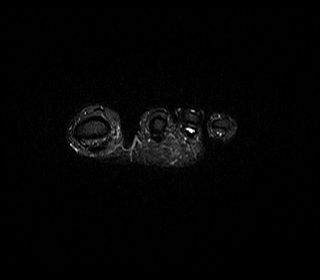
[im 21/42]
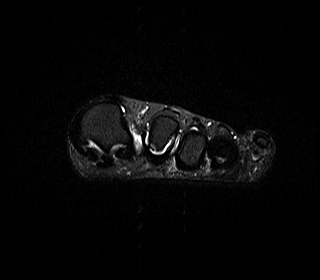
[im 28/42]
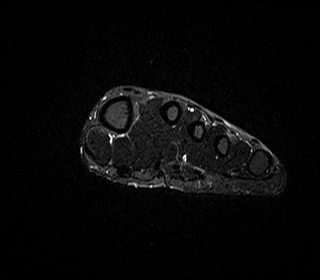
[im 35/42]
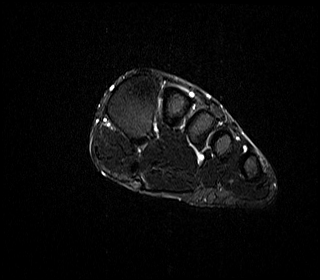
[im 42/42]
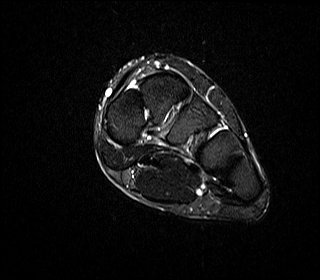

[Series 5: t1_sag · sagittal · left · 3.0mm · 0.43mm/px · 6 of 32 slices shown]
[im 1/32]
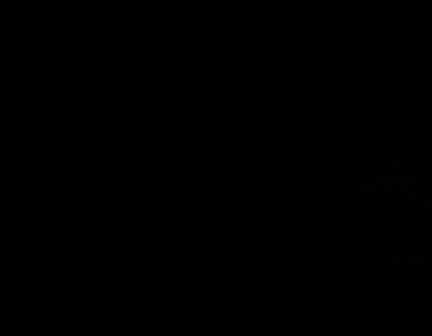
[im 7/32]
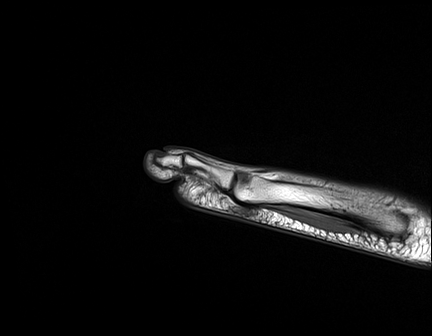
[im 13/32]
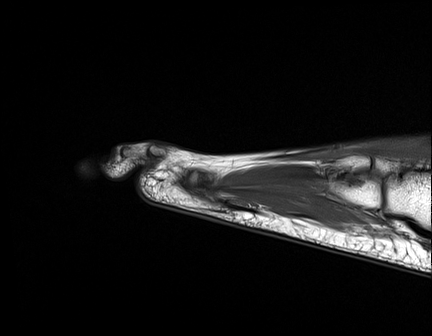
[im 19/32]
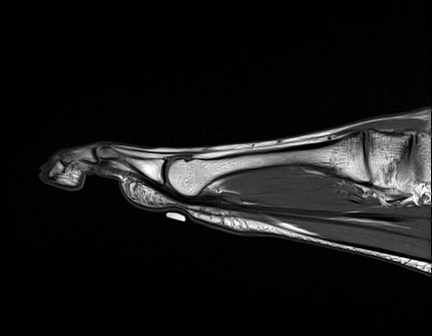
[im 25/32]
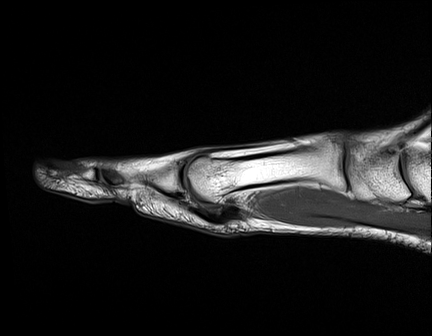
[im 32/32]
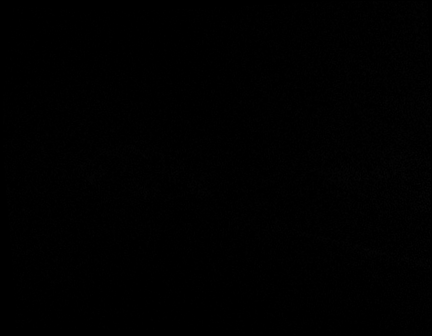

[Series 6: t2_sag_fs · sagittal · left · 3.0mm · 0.58mm/px · 6 of 30 slices shown]
[im 1/30]
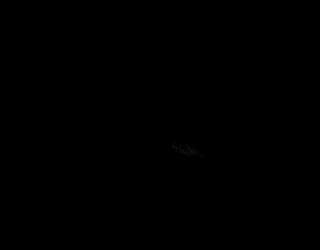
[im 6/30]
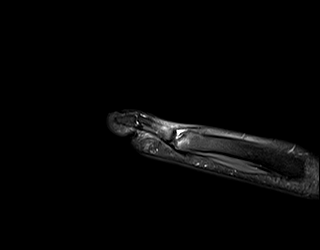
[im 12/30]
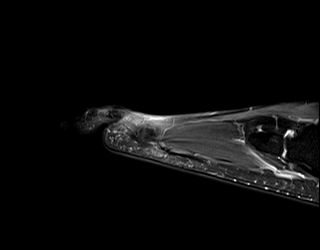
[im 18/30]
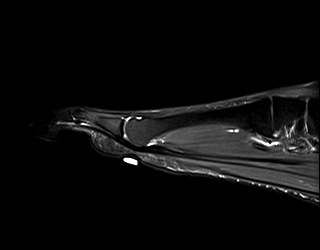
[im 24/30]
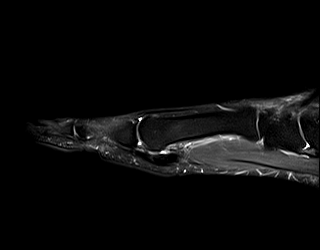
[im 30/30]
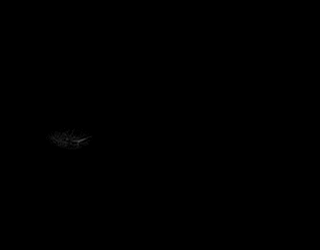

[Series 8: ir_sag · sagittal · left · 3.0mm · 0.43mm/px · 6 of 32 slices shown]
[im 1/32]
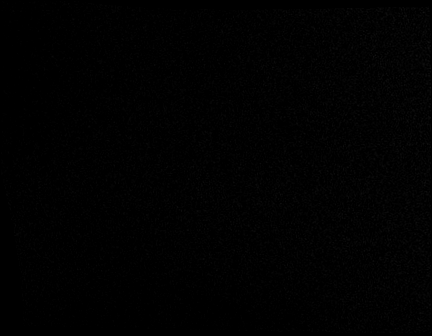
[im 7/32]
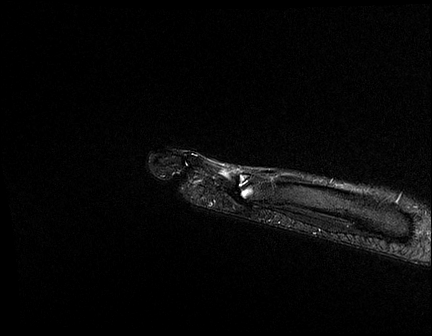
[im 13/32]
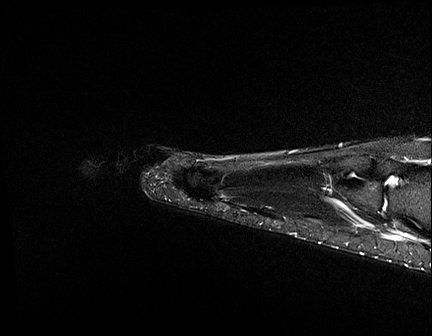
[im 19/32]
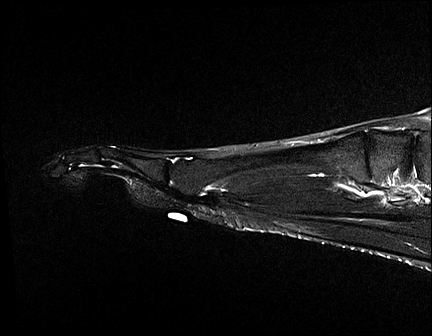
[im 25/32]
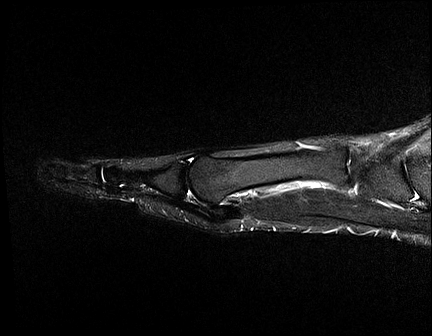
[im 32/32]
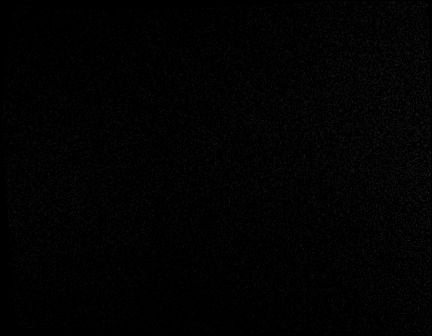

[Series 9: t1_axial · axial · left · 3.0mm · 0.41mm/px · z∈[-38,+21]mm · 4 of 20 slices shown]
[im 1/20]
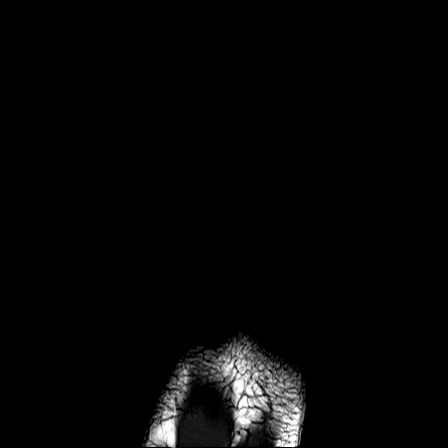
[im 7/20]
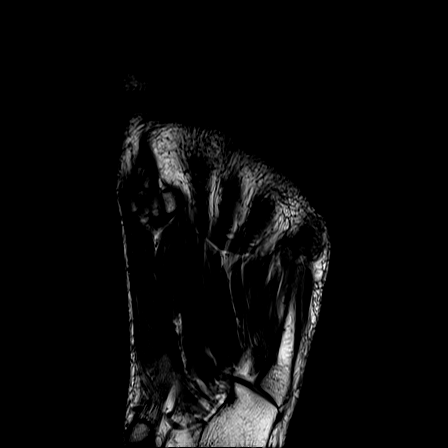
[im 13/20]
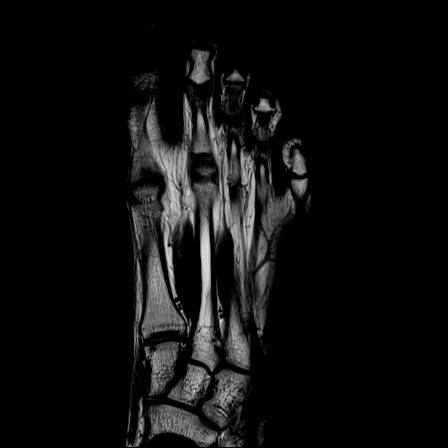
[im 20/20]
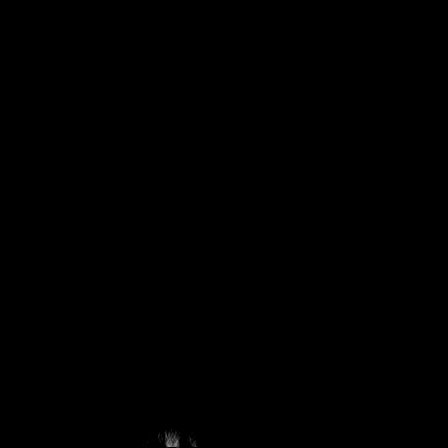

[36 of 40 positions shown; findings below may reference images not displayed]

FINDINGS: No acute fracture. No bone contusion. No erosion. No subluxation of the TMT joints. Lisfranc ligament is intact. No midfoot arthropathy evident. No chondral defects in the forefoot. No joint effusion.

No collateral ligament injury of the MTP joints. No plantar plate injury evident of the MTP joints. Hallux sesamoids are intact with no sesamoiditis or arthropathy. No tenosynovitis. No acute tendon injury or tendinosis. Visualized plantar aponeurosis is intact. No acute muscle injury. No muscle atrophy. Mild intermetatarsal bursitis between the second and third MTP joints. No Morton's neuroma. Subcutaneous edema and fibrosis along the plantar aspect of the third metatarsal head, likely a pressure lesion/early adventitial bursitis. Significant subcutaneous fibrosis along the plantar aspect of the fifth metatarsal head, likely partial lesion. No suspicious soft tissue mass. No fluid collection.
IMPRESSION: 1.
No acute osseous or soft tissue finding.

2.
Subcutaneous edema and fibrosis along the plantar aspect of the third MTP joint, compatible with pressure lesion/adventitial bursitis.

3.
Mild intermetatarsal bursitis between the second-third MTP joints.

## 2022-09-08 IMAGING — MR MRI FOOT RT WO CONTRAST
6 series · 40 of 40 positions shown · non-contrast
Comparison: Right forefoot MRI 08/28/2021.

Images Obtained from [facility]
INDICATION: Encounter for orthopedic aftercare. Pain in right foot. Osteomyelitis, ankle and foot.
TECHNIQUE: Multiplanar, multiecho imaging was performed, including T1-weighted and fluid-sensitive sequences of right foot without contrast.

[Series 3: t1_sag · sagittal · right · 3.0mm · 0.43mm/px · 7 of 30 slices shown]
[im 1/30]
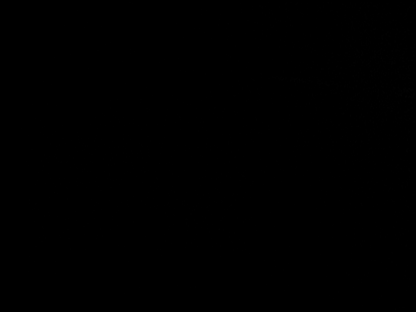
[im 5/30]
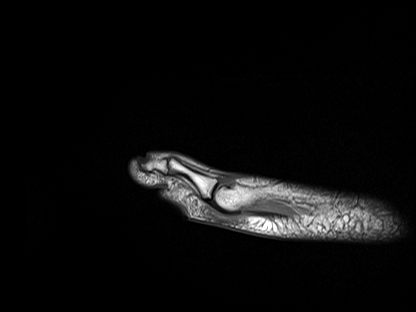
[im 10/30]
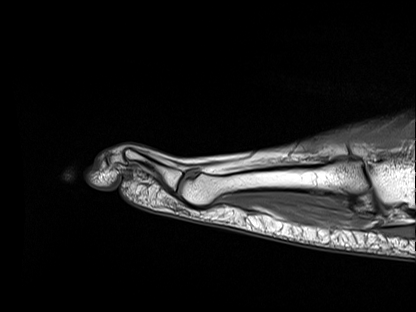
[im 15/30]
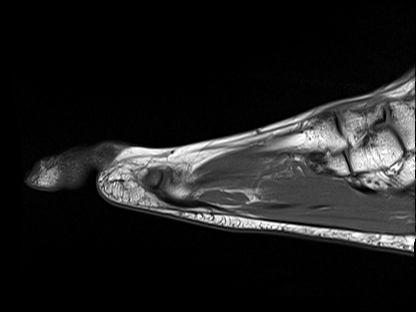
[im 20/30]
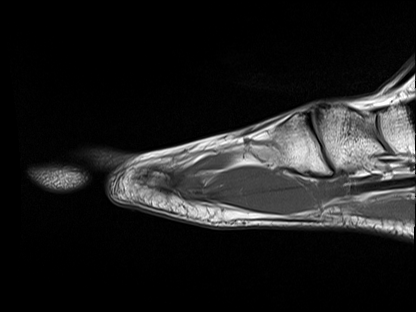
[im 25/30]
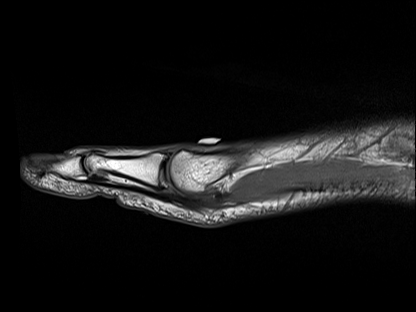
[im 30/30]
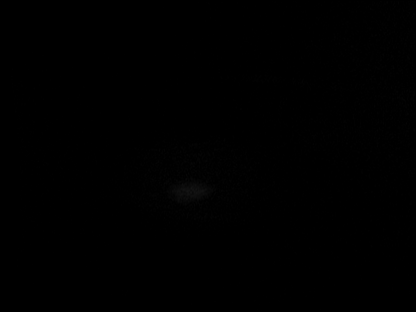

[Series 4: t2_sag_fs · sagittal · right · 3.0mm · 0.43mm/px · 6 of 30 slices shown]
[im 1/30]
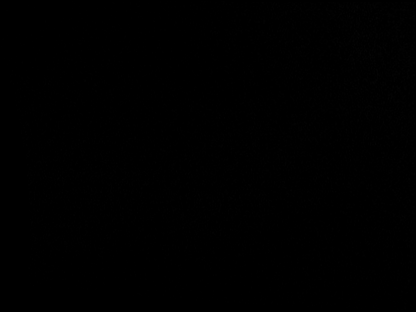
[im 6/30]
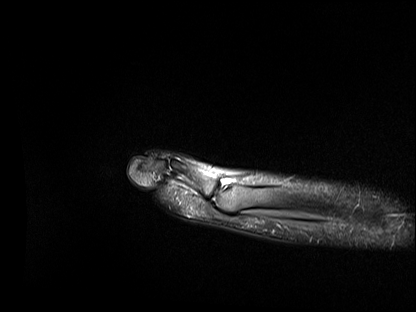
[im 12/30]
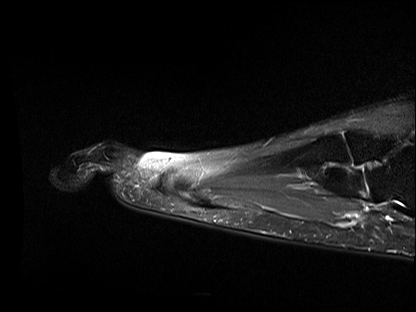
[im 18/30]
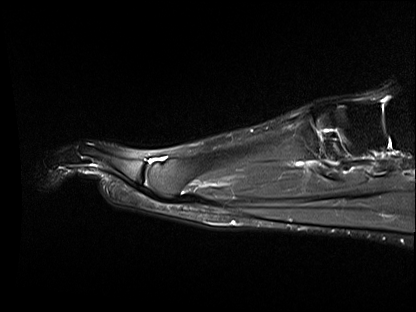
[im 24/30]
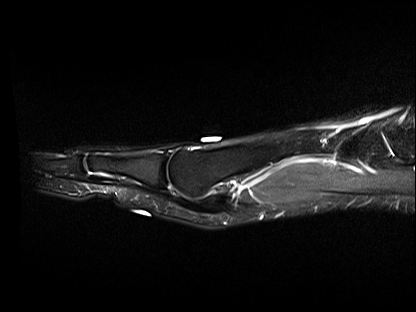
[im 30/30]
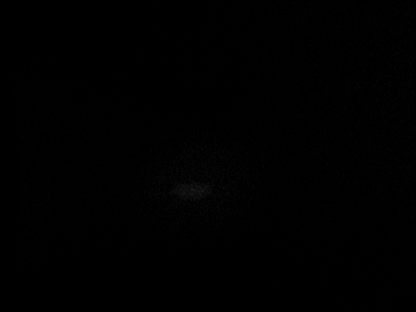

[Series 5: t1_axial · axial · right · 3.0mm · 0.41mm/px · z∈[-135,-62]mm · 5 of 24 slices shown]
[im 1/24]
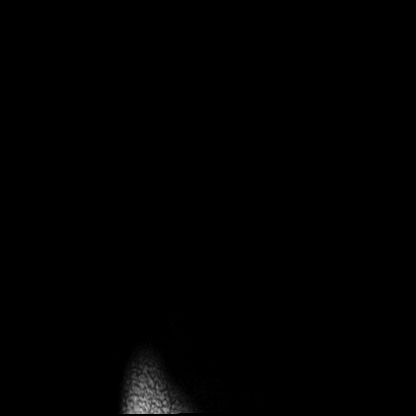
[im 6/24]
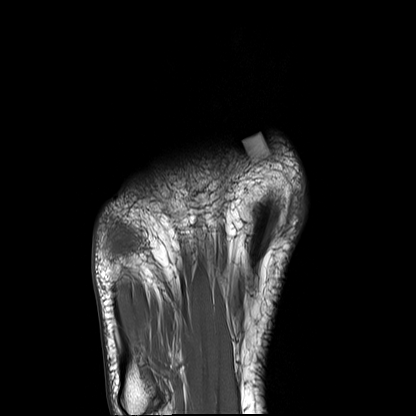
[im 12/24]
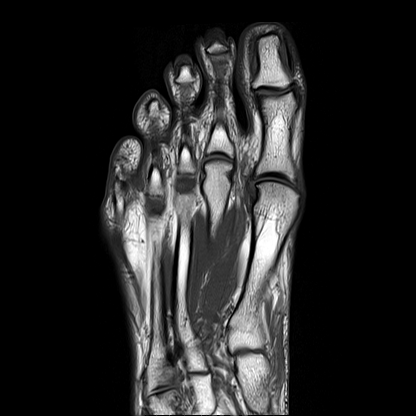
[im 18/24]
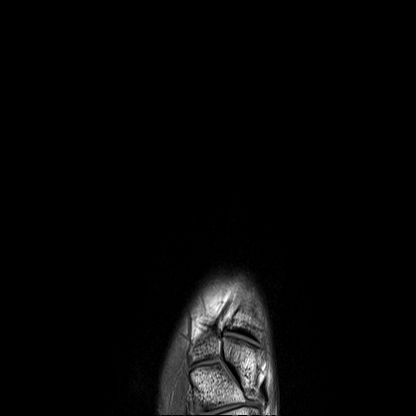
[im 24/24]
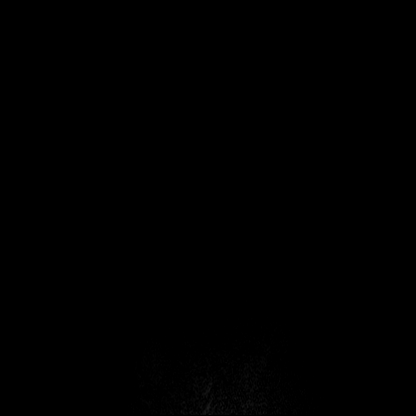

[Series 6: t2_axial_fs · axial · right · 3.0mm · 0.53mm/px · z∈[-135,-62]mm · 5 of 24 slices shown]
[im 1/24]
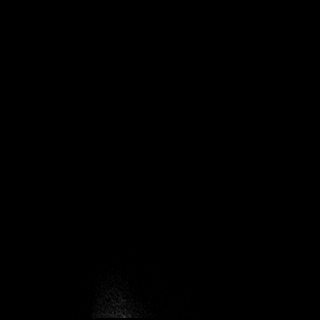
[im 6/24]
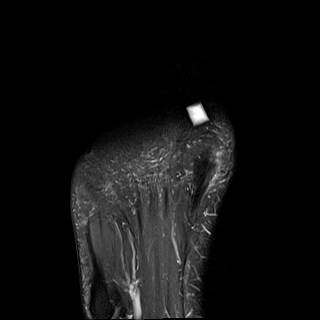
[im 12/24]
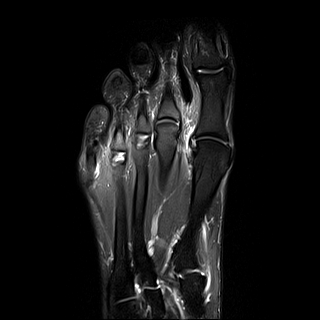
[im 18/24]
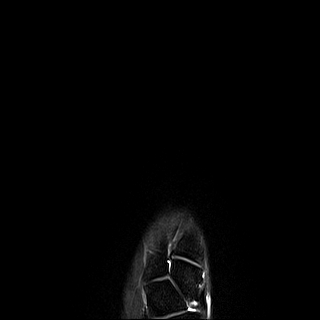
[im 24/24]
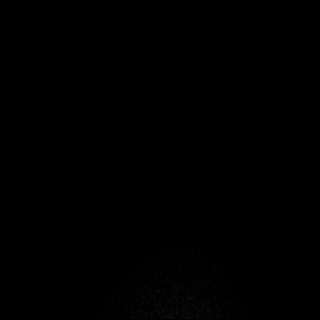

[Series 7: t1_cor · coronal · right · 3.0mm · 0.36mm/px · 8 of 38 slices shown]
[im 1/38]
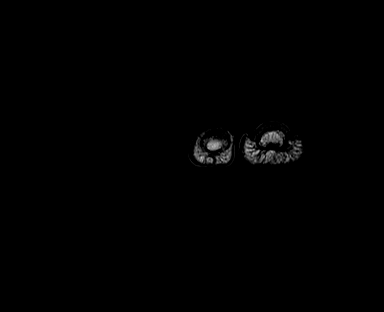
[im 6/38]
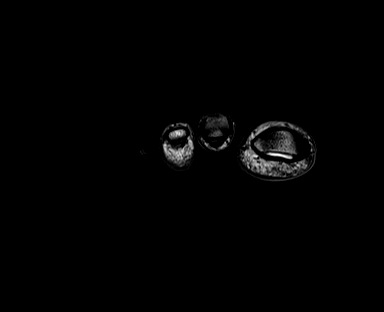
[im 11/38]
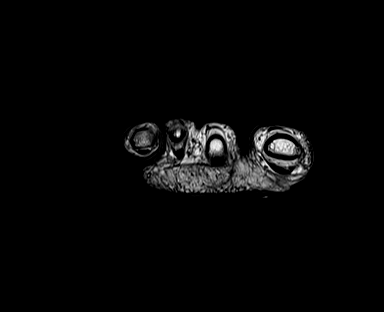
[im 16/38]
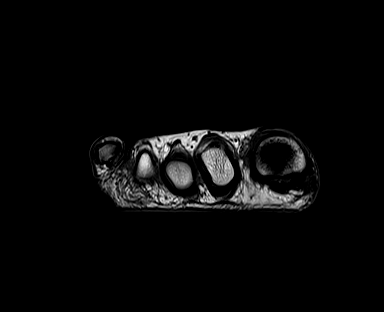
[im 22/38]
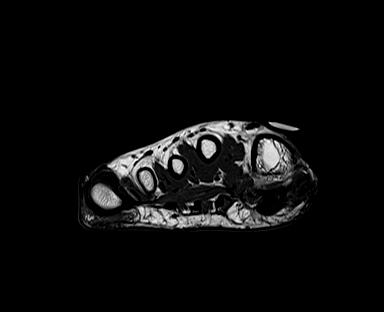
[im 27/38]
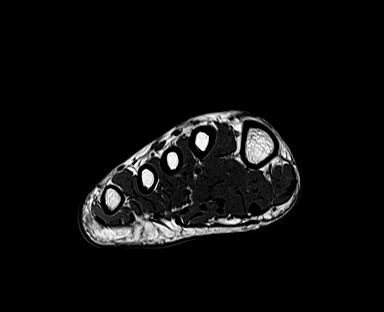
[im 32/38]
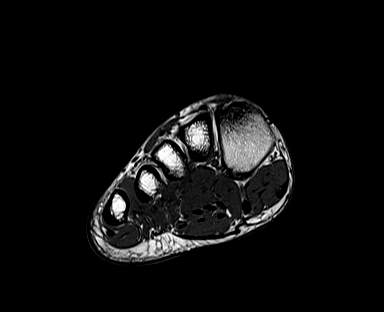
[im 38/38]
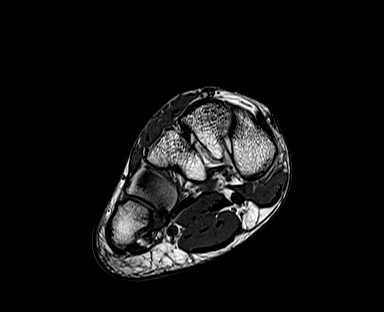

[Series 8: ir_cor · coronal · right · 3.0mm · 0.47mm/px · 9 of 40 slices shown]
[im 1/40]
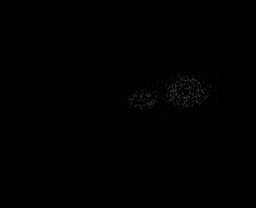
[im 5/40]
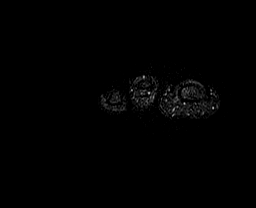
[im 10/40]
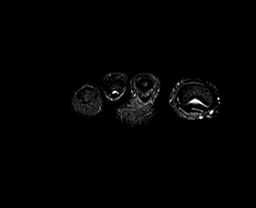
[im 15/40]
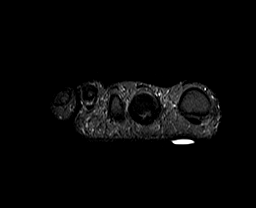
[im 20/40]
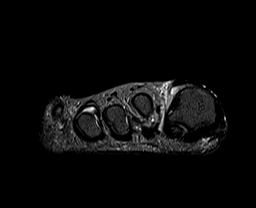
[im 25/40]
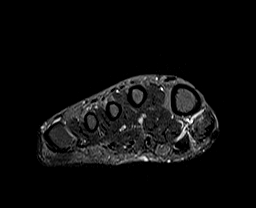
[im 30/40]
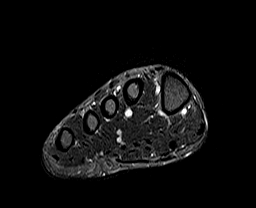
[im 35/40]
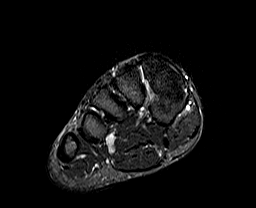
[im 40/40]
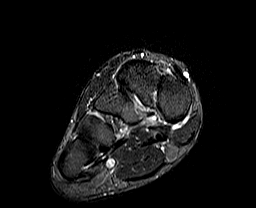

[40 of 40 positions shown; findings below may reference images not displayed]

FINDINGS: No acute fracture. No bone contusion. Lisfranc ligament is intact. Midfoot is unremarkable. No joint effusion. No collateral ligament injury. Status post tibial hallux sesamoidectomy. No marrow
signal changes to suggest acute osteomyelitis. Normal fibular hallux sesamoid. No tenosynovitis. No acute tendon injury. No acute muscle injury. No muscle atrophy. No fluid collection. No soft tissue
mass. Mild intermetatarsal bursitis between the first-second metatarsal heads. Mild subcutaneous edema along the dorsal/lateral aspect of the first MTP joint. Visualized plantar aponeurosis is
intact. No plantar fibroma. No adventitial bursitis. Moderate subcutaneous fibrosis along the plantar and lateral aspect of the fifth MTP joint, compatible with pressure lesions. Focal skin
thickening and scarring along the plantar/medial aspect of the first metatarsal head, compatible with postoperative change.
IMPRESSION: 1.  No evidence of acute osteomyelitis.
2.  Status post tibial hallux sesamoidectomy.
3.  Mild intermetatarsal bursitis between the first-second MTP joints. Mild focal subcutaneous edema along the dorsal/lateral aspect of the first MTP joint.
4.  Moderate focal subcutaneous fibrosis along the plantar and lateral aspect of the fifth MTP joint, compatible with pressure lesions. No adventitial bursitis.
